# Patient Record
Sex: Female | Born: 1985
Health system: Southern US, Community
[De-identification: ages and names within clinical notes are randomized; demographics above are authoritative.]

## PROBLEM LIST (undated history)

## (undated) DIAGNOSIS — F32A Depression, unspecified: Secondary | ICD-10-CM

## (undated) DIAGNOSIS — F419 Anxiety disorder, unspecified: Secondary | ICD-10-CM

## (undated) DIAGNOSIS — F4310 Post-traumatic stress disorder, unspecified: Secondary | ICD-10-CM

## (undated) DIAGNOSIS — K589 Irritable bowel syndrome without diarrhea: Secondary | ICD-10-CM

## (undated) HISTORY — DX: Anxiety disorder, unspecified: F41.9

## (undated) HISTORY — DX: Post-traumatic stress disorder, unspecified: F43.10

## (undated) HISTORY — DX: Irritable bowel syndrome without diarrhea: K58.9

## (undated) HISTORY — PX: ELBOW FRACTURE SURGERY: SHX616

## (undated) HISTORY — DX: Depression, unspecified: F32.A

---
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
## 2010-12-28 HISTORY — PX: LEEP: SHX91

## 2013-11-16 ENCOUNTER — Other Ambulatory Visit: Payer: Self-pay | Admitting: Endocrinology

## 2013-11-16 DIAGNOSIS — E229 Hyperfunction of pituitary gland, unspecified: Secondary | ICD-10-CM

## 2013-11-28 ENCOUNTER — Ambulatory Visit
Admission: RE | Admit: 2013-11-28 | Discharge: 2013-11-28 | Disposition: A | Discharge status: Home / Self Care | Payer: BC Managed Care – PPO | Source: Ambulatory Visit | Attending: Endocrinology | Admitting: Endocrinology

## 2013-11-28 DIAGNOSIS — E229 Hyperfunction of pituitary gland, unspecified: Secondary | ICD-10-CM

## 2013-11-28 MED ORDER — GADOBENATE DIMEGLUMINE 529 MG/ML IV SOLN
5.0000 mL | Freq: Once | INTRAVENOUS | Status: AC | PRN
  Administered 2013-11-28: 5 mL via INTRAVENOUS

## 2015-10-10 DIAGNOSIS — S62109A Fracture of unspecified carpal bone, unspecified wrist, initial encounter for closed fracture: Secondary | ICD-10-CM

## 2015-10-10 HISTORY — DX: Fracture of unspecified carpal bone, unspecified wrist, initial encounter for closed fracture: S62.109A

## 2017-05-13 DIAGNOSIS — H6983 Other specified disorders of Eustachian tube, bilateral: Secondary | ICD-10-CM

## 2017-05-13 HISTORY — DX: Other specified disorders of eustachian tube, bilateral: H69.83

## 2018-04-21 ENCOUNTER — Emergency Department (HOSPITAL_COMMUNITY): Payer: Self-pay

## 2018-04-21 ENCOUNTER — Encounter (HOSPITAL_COMMUNITY): Payer: Self-pay | Admitting: Emergency Medicine

## 2018-04-21 ENCOUNTER — Emergency Department (HOSPITAL_COMMUNITY)
Admission: EM | Admit: 2018-04-21 | Discharge: 2018-04-21 | Disposition: A | Discharge status: Home / Self Care | Payer: Self-pay | Attending: Emergency Medicine | Admitting: Emergency Medicine

## 2018-04-21 DIAGNOSIS — K5900 Constipation, unspecified: Secondary | ICD-10-CM | POA: Insufficient documentation

## 2018-04-21 DIAGNOSIS — R1011 Right upper quadrant pain: Secondary | ICD-10-CM

## 2018-04-21 DIAGNOSIS — R112 Nausea with vomiting, unspecified: Secondary | ICD-10-CM | POA: Insufficient documentation

## 2018-04-21 DIAGNOSIS — Z79899 Other long term (current) drug therapy: Secondary | ICD-10-CM | POA: Insufficient documentation

## 2018-04-21 LAB — URINALYSIS, ROUTINE W REFLEX MICROSCOPIC
BILIRUBIN URINE: NEGATIVE
Glucose, UA: NEGATIVE mg/dL
KETONES UR: NEGATIVE mg/dL
Nitrite: NEGATIVE
PH: 9 — AB (ref 5.0–8.0)
Protein, ur: NEGATIVE mg/dL
Specific Gravity, Urine: 1.002 — ABNORMAL LOW (ref 1.005–1.030)

## 2018-04-21 LAB — CBC
HCT: 44.7 % (ref 36.0–46.0)
Hemoglobin: 15.4 g/dL — ABNORMAL HIGH (ref 12.0–15.0)
MCH: 32.4 pg (ref 26.0–34.0)
MCHC: 34.5 g/dL (ref 30.0–36.0)
MCV: 94.1 fL (ref 78.0–100.0)
PLATELETS: 182 10*3/uL (ref 150–400)
RBC: 4.75 MIL/uL (ref 3.87–5.11)
RDW: 12.2 % (ref 11.5–15.5)
WBC: 4.2 10*3/uL (ref 4.0–10.5)

## 2018-04-21 LAB — COMPREHENSIVE METABOLIC PANEL
ALT: 16 U/L (ref 14–54)
ANION GAP: 9 (ref 5–15)
AST: 20 U/L (ref 15–41)
Albumin: 4.7 g/dL (ref 3.5–5.0)
Alkaline Phosphatase: 47 U/L (ref 38–126)
BUN: 16 mg/dL (ref 6–20)
CHLORIDE: 105 mmol/L (ref 101–111)
CO2: 25 mmol/L (ref 22–32)
Calcium: 9.9 mg/dL (ref 8.9–10.3)
Creatinine, Ser: 0.66 mg/dL (ref 0.44–1.00)
Glucose, Bld: 98 mg/dL (ref 65–99)
POTASSIUM: 4.4 mmol/L (ref 3.5–5.1)
SODIUM: 139 mmol/L (ref 135–145)
Total Bilirubin: 1 mg/dL (ref 0.3–1.2)
Total Protein: 8.2 g/dL — ABNORMAL HIGH (ref 6.5–8.1)

## 2018-04-21 LAB — LIPASE, BLOOD: LIPASE: 56 U/L — AB (ref 11–51)

## 2018-04-21 LAB — I-STAT BETA HCG BLOOD, ED (MC, WL, AP ONLY): I-stat hCG, quantitative: <5 m[IU]/mL (ref <5)

## 2018-04-21 MED ORDER — IOPAMIDOL (ISOVUE-300) INJECTION 61%
INTRAVENOUS | Status: AC
  Filled 2018-04-21: qty 100

## 2018-04-21 MED ORDER — IOPAMIDOL (ISOVUE-300) INJECTION 61%
100.0000 mL | Freq: Once | INTRAVENOUS | Status: AC | PRN
  Administered 2018-04-21: 100 mL via INTRAVENOUS

## 2018-04-21 MED ORDER — POLYETHYLENE GLYCOL 3350 17 G PO PACK: 17.0000 g | PACK | Freq: Every day | ORAL | 0 refills | Status: DC

## 2018-04-21 MED ORDER — SODIUM CHLORIDE 0.9 % IJ SOLN
INTRAMUSCULAR | Status: AC
  Filled 2018-04-21: qty 50

## 2018-04-21 MED ORDER — ONDANSETRON 4 MG PO TBDP
4.0000 mg | ORAL_TABLET | Freq: Once | ORAL | Status: AC
  Administered 2018-04-21: 4 mg via ORAL
  Filled 2018-04-21: qty 1

## 2018-04-21 MED ORDER — MORPHINE SULFATE (PF) 4 MG/ML IV SOLN
4.0000 mg | Freq: Once | INTRAVENOUS | Status: AC
  Administered 2018-04-21: 4 mg via INTRAVENOUS
  Filled 2018-04-21: qty 1

## 2018-04-21 MED ORDER — SODIUM CHLORIDE 0.9 % IV BOLUS
1000.0000 mL | Freq: Once | INTRAVENOUS | Status: AC
  Administered 2018-04-21: 1000 mL via INTRAVENOUS

## 2018-04-21 NOTE — ED Triage Notes (Signed)
Pt c/o RUQ pains since Saturday with n/v that is worse after eating or drinking.  Reports having diarrhea and then constipation. Family hx gallbladder issues.

## 2018-04-21 NOTE — ED Provider Notes (Signed)
[facility]-EMERGENCY DEPT Provider Note   CSN: [id number] Arrival date & time: 04/21/18  1001     History   Chief Complaint Chief Complaint  Patient presents with  . RUQ pain  . Emesis    HPI Emily Morton is a 32 y.o. female.  HPI  Patient presents with complaint of right upper quadrant pain associated with nausea and vomiting.  She states the symptoms have been intermittent over the past year but over the past 1 week have been much worse.  5 days ago she had intense pain after eating and vomiting.  Vomiting is nonbloody and nonbilious.  Over the past several days she has been eating less and has occasionally had vomiting but the pain has never completely gone away.  This morning she tried to drink coffee for breakfast and had another episode of emesis with worsening pain.  This prompted the visit to the ED.  No fever or chills.  No change in her stools.  She states her abdomen feels bloated.  No history of abdominal surgery.  She has not had any treatment prior to arrival.  There are no other associated systemic symptoms, there are no other alleviating or modifying factors.  Pain is described as crampy and sharp.  History reviewed. No pertinent past medical history.  There are no active problems to display for this patient.   History reviewed. No pertinent surgical history.   OB History   None      Home Medications    Prior to Admission medications   Medication Sig Start Date End Date Taking? Authorizing Provider  B Complex Vitamins (B COMPLEX PO) Take by mouth.   Yes [provider]  levonorgestrel (MIRENA) 20 MCG/24HR IUD 1 each by Intrauterine route once.   Yes [provider]  Multiple Vitamin (MULTIVITAMIN WITH MINERALS) TABS tablet Take 1 tablet by mouth daily.   Yes [provider]  Omega-3 Fatty Acids (FISH OIL PO) Take by mouth.   Yes [provider]  polyethylene glycol (MIRALAX) packet Take 17 g by mouth  daily. 04/21/18   Dajiah Kooi, Latanya Maudlin, MD    Family History No family history on file.  Social History Social History   Tobacco Use  . Smoking status: Former Games developer  . Smokeless tobacco: Never Used  Substance Use Topics  . Alcohol use: Not Currently  . Drug use: Not on file     Allergies   Patient has no known allergies.   Review of Systems Review of Systems  ROS reviewed and all otherwise negative except for mentioned in HPI   Physical Exam Updated Vital Signs BP 103/73 (BP Location: Left Arm)   Pulse 84   Temp 97.7 F (36.5 C) (Oral)   Resp 17   Ht 5\' 1"  (1.549 m)   Wt 58.1 kg (128 lb)   LMP 04/13/2018   SpO2 100%   BMI 24.19 kg/m  Vitals reviewed Physical Exam  Physical Examination: General appearance - alert, well appearing, and in no distress Mental status - alert, oriented to person, place, and time Eyes - no scleral icterus, no conjunctival injection Mouth - mucous membranes moist, pharynx normal without lesions Neck - supple, no significant adenopathy Chest - clear to auscultation, no wheezes, rales or rhonchi, symmetric air entry Heart - normal rate, regular rhythm, normal S1, S2, no murmurs, rubs, clicks or gallops Abdomen - soft, ttp in right upper abdomen, no gaurding or rebound tenderness, nabs, nondistended Neurological - alert, oriented, normal  speechh Extremities - peripheral pulses normal, no pedal edema, no clubbing or cyanosis Skin - normal coloration and turgor, no rashes   ED Treatments / Results  Labs (all labs ordered are listed, but only abnormal results are displayed) Labs Reviewed  LIPASE, BLOOD - Abnormal; Notable for the following components:      Result Value   Lipase 56 (*)    All other components within normal limits  COMPREHENSIVE METABOLIC PANEL - Abnormal; Notable for the following components:   Total Protein 8.2 (*)    All other components within normal limits  CBC - Abnormal; Notable for the following components:    Hemoglobin 15.4 (*)    All other components within normal limits  URINALYSIS, ROUTINE W REFLEX MICROSCOPIC - Abnormal; Notable for the following components:   APPearance HAZY (*)    Specific Gravity, Urine 1.002 (*)    pH 9.0 (*)    Hgb urine dipstick MODERATE (*)    Leukocytes, UA MODERATE (*)    Bacteria, UA RARE (*)    All other components within normal limits  I-STAT BETA HCG BLOOD, ED ([facility], [facility], [facility] ONLY)    EKG None  Radiology Ct Abdomen Pelvis W Contrast  Result Date: 04/21/2018 CLINICAL DATA:  Right upper quadrant pain since Saturday with nausea and vomiting. Alternating diarrhea and constipation. EXAM: CT ABDOMEN AND PELVIS WITH CONTRAST TECHNIQUE: Multidetector CT imaging of the abdomen and pelvis was performed using the standard protocol following bolus administration of intravenous contrast. CONTRAST:  100mL ISOVUE-300 IOPAMIDOL (ISOVUE-300) INJECTION 61% COMPARISON:  Right upper quadrant abdomen ultrasound 04/21/2018 FINDINGS: Lower chest: Normal heart size without pericardial effusion. Dependent bibasilar atelectasis. Hepatobiliary: No space-occupying enhancing mass of the liver. Physiologic distention of the gallbladder without stones. No biliary dilatation. Pancreas: Normal Spleen: Normal Adrenals/Urinary Tract: Normal Stomach/Bowel: The stomach is decompressed. There is normal small bowel rotation without bowel obstruction or inflammation. Normal appendix. Moderate fecal retention within the colon without bowel obstruction or inflammation. Vascular/Lymphatic: No significant vascular findings are present. No enlarged abdominal or pelvic lymph nodes. Reproductive: IUD in the endometrial cavity.  No adnexal mass. Other: No free air nor free fluid.  No herniation. Musculoskeletal: No acute or significant osseous findings. IMPRESSION: Moderate fecal retention within the colon without inflammation. Findings would suggest constipation. Otherwise, normal CT of the abdomen and pelvis.  Electronically Signed   By: Tollie Ethavid  Kwon M.D.   On: 04/21/2018 20:43   [place] Abdomen Limited  Result Date: 04/21/2018 CLINICAL DATA:  Right upper quadrant pain for 1 year EXAM: ULTRASOUND ABDOMEN LIMITED RIGHT UPPER QUADRANT COMPARISON:  None. FINDINGS: Gallbladder: No gallstones or wall thickening visualized. No sonographic Murphy sign noted by sonographer. Common bile duct: Diameter: 2.4 mm Liver: No focal lesion identified. Within normal limits in parenchymal echogenicity. Portal vein is patent on color Doppler imaging with normal direction of blood flow towards the liver. IMPRESSION: Negative right upper quadrant abdominal ultrasound Electronically Signed   By: Jasmine PangKim  Fujinaga M.D.   On: 04/21/2018 17:57    Procedures Procedures (including critical care time)  Medications Ordered in ED Medications  sodium chloride 0.9 % injection (has no administration in time range)  iopamidol (ISOVUE-300) 61 % injection (has no administration in time range)  ondansetron (ZOFRAN-ODT) disintegrating tablet 4 mg (4 mg Oral Given 04/21/18 1015)  morphine 4 MG/ML injection 4 mg (4 mg Intravenous Given 04/21/18 1743)  sodium chloride 0.9 % bolus 1,000 mL (0 mLs Intravenous Stopped 04/21/18 1856)  iopamidol (ISOVUE-300) 61 % injection  100 mL (100 mLs Intravenous Contrast Given 04/21/18 2023)     Initial Impression / Assessment and Plan / ED Course  I have reviewed the triage vital signs and the nursing notes.  Pertinent labs & imaging results that were available during my care of the patient were reviewed by me and considered in my medical decision making (see chart for details).    Patient presenting with complaint of right upper quadrant pain.  She states the pain is been constant for the past week.  Her labs are reassuring with the exception of very mild elevation in lipase.  Symptoms consistent with biliary colic so right upper quadrant ultrasound obtained.  This was reassuring with no gallstones or inflammation.   Due to ongoing pain and mild elevation in lipase abdominal CT was obtained.  This was also reassuring.  There were findings consistent with constipation.  Patient has no history of breath and states she has frequent bowel movement however with the findings seen on scan will start on MiraLAX.  All results and plan discussed with patient at the bedside.  Discharged with strict return precautions.  Pt agreeable with plan.  Final Clinical Impressions(s) / ED Diagnoses   Final diagnoses:  Right upper quadrant abdominal pain  Constipation, unspecified constipation type    ED Discharge Orders        Ordered    polyethylene glycol (MIRALAX) packet  Daily     04/21/18 2120       Phillis Haggis, MD 04/21/18 2323

## 2018-04-21 NOTE — ED Notes (Signed)
Pt had drawn for labs:  Gold Red Blue Lavender Lt green Dark green x2 

## 2018-04-21 NOTE — Discharge Instructions (Signed)
Return to the ED with any concerns including vomiting and not able to keep down liquids or your medications, abdominal pain especially if it localizes to the right lower abdomen, fever or chills, and decreased urine output, decreased level of alertness or lethargy, or any other alarming symptoms.  °

## 2018-04-21 NOTE — ED Notes (Signed)
Ultrasound at the bedside

## 2018-05-30 DIAGNOSIS — R0781 Pleurodynia: Secondary | ICD-10-CM | POA: Diagnosis not present

## 2018-05-30 DIAGNOSIS — R350 Frequency of micturition: Secondary | ICD-10-CM | POA: Diagnosis not present

## 2018-05-30 DIAGNOSIS — K5909 Other constipation: Secondary | ICD-10-CM | POA: Diagnosis not present

## 2018-05-30 DIAGNOSIS — N390 Urinary tract infection, site not specified: Secondary | ICD-10-CM | POA: Diagnosis not present

## 2018-05-30 DIAGNOSIS — M940 Chondrocostal junction syndrome [Tietze]: Secondary | ICD-10-CM | POA: Diagnosis not present

## 2018-06-29 DIAGNOSIS — R102 Pelvic and perineal pain: Secondary | ICD-10-CM | POA: Diagnosis not present

## 2018-06-29 DIAGNOSIS — Z01419 Encounter for gynecological examination (general) (routine) without abnormal findings: Secondary | ICD-10-CM | POA: Diagnosis not present

## 2018-06-29 DIAGNOSIS — R140 Abdominal distension (gaseous): Secondary | ICD-10-CM | POA: Diagnosis not present

## 2018-06-29 DIAGNOSIS — Z6824 Body mass index (BMI) 24.0-24.9, adult: Secondary | ICD-10-CM | POA: Diagnosis not present

## 2018-07-20 DIAGNOSIS — Z1329 Encounter for screening for other suspected endocrine disorder: Secondary | ICD-10-CM | POA: Diagnosis not present

## 2018-07-20 DIAGNOSIS — R102 Pelvic and perineal pain: Secondary | ICD-10-CM | POA: Diagnosis not present

## 2018-07-20 DIAGNOSIS — Z0000 Encounter for general adult medical examination without abnormal findings: Secondary | ICD-10-CM | POA: Diagnosis not present

## 2018-07-20 DIAGNOSIS — Z13220 Encounter for screening for lipoid disorders: Secondary | ICD-10-CM | POA: Diagnosis not present

## 2018-07-20 DIAGNOSIS — Z130 Encounter for screening for diseases of the blood and blood-forming organs and certain disorders involving the immune mechanism: Secondary | ICD-10-CM | POA: Diagnosis not present

## 2019-08-03 IMAGING — US US ABDOMEN LIMITED
1 series · 14 of 25 positions shown · non-contrast
Comparison: None.

CLINICAL DATA: Right upper quadrant pain for 1 year

EXAM:
ULTRASOUND ABDOMEN LIMITED RIGHT UPPER QUADRANT

[Series 1: us abdomen limited · 14 of 61 slices shown]
[im 1/61]
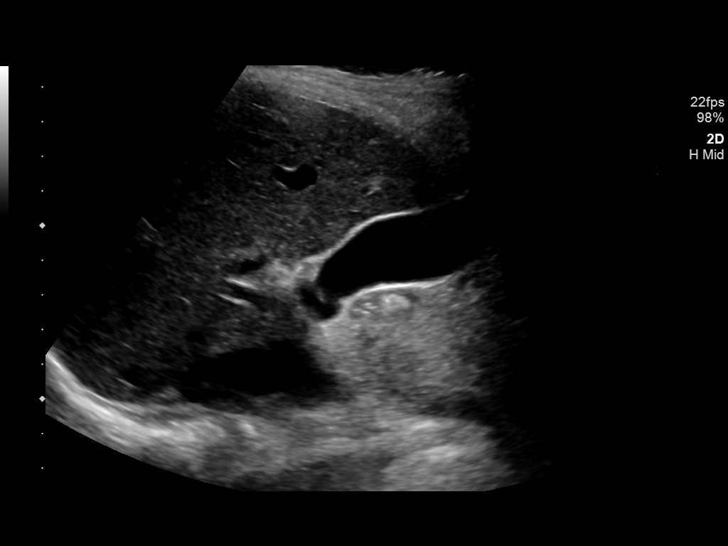
[im 6/61]
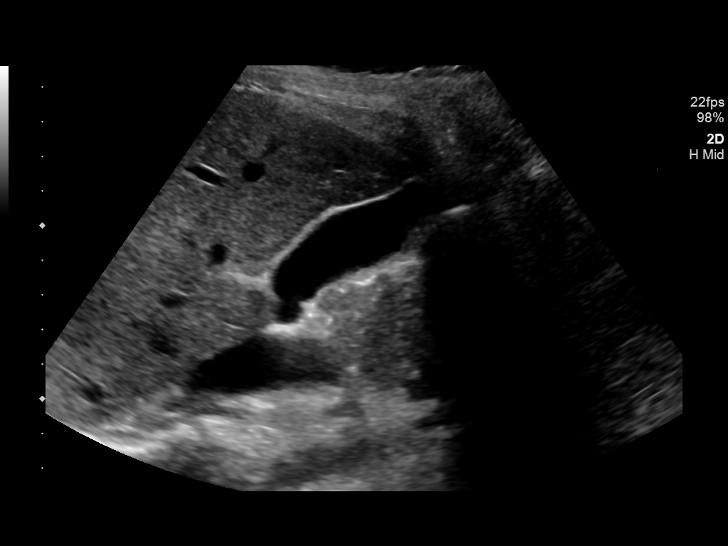
[im 11/61]
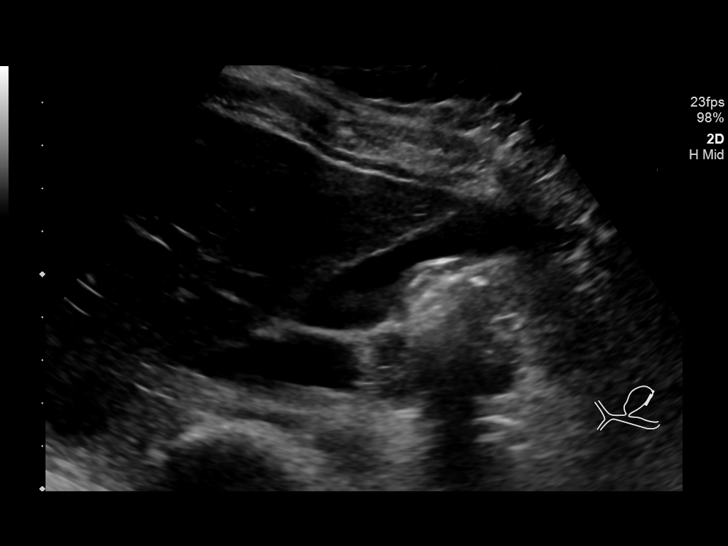
[im 16/61]
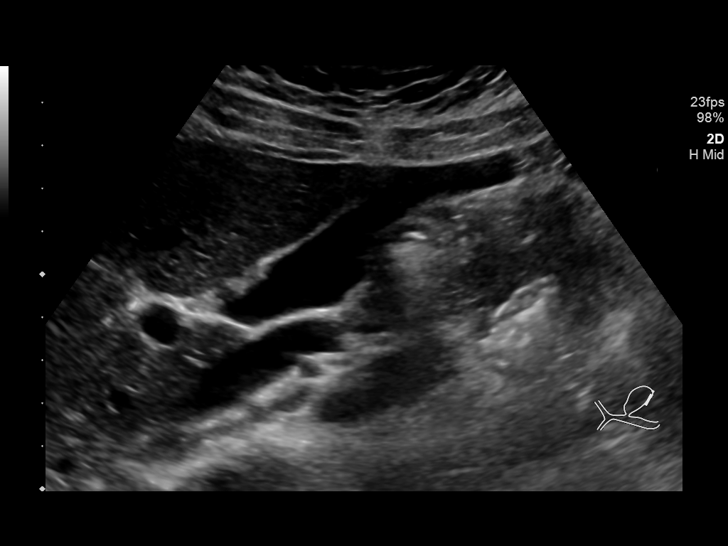
[im 21/61]
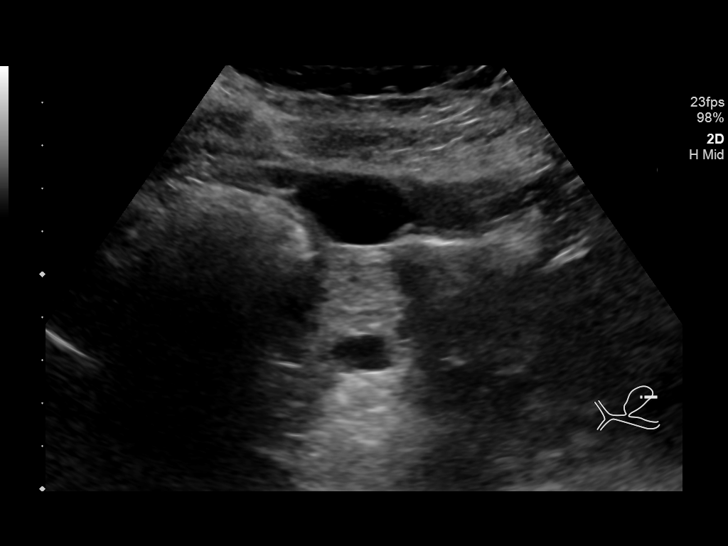
[im 23/61]
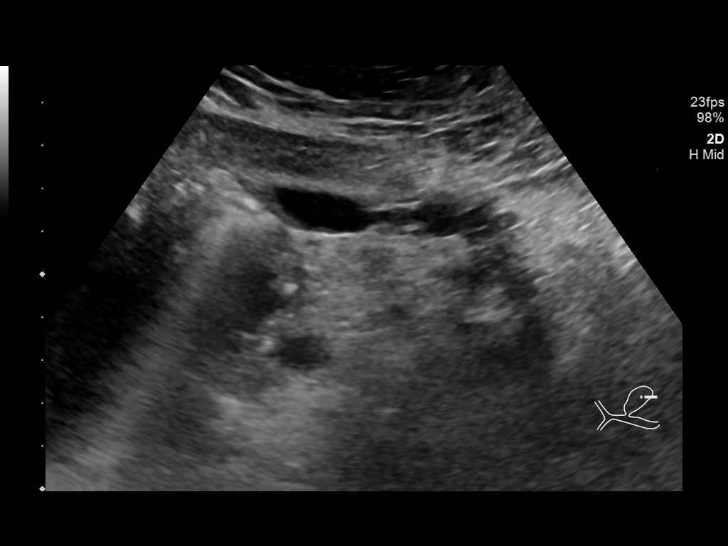
[im 28/61]
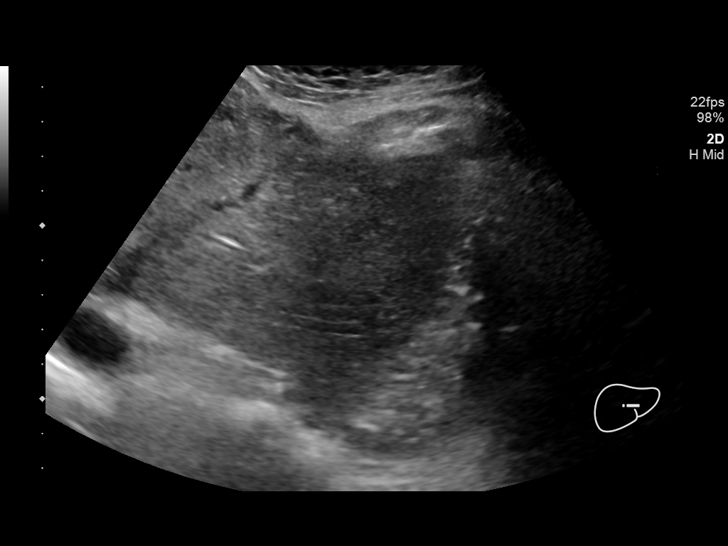
[im 33/61]
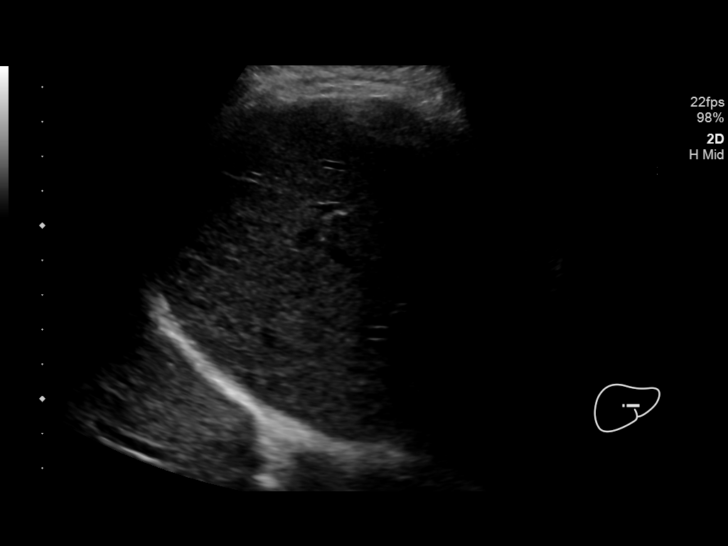
[im 38/61]
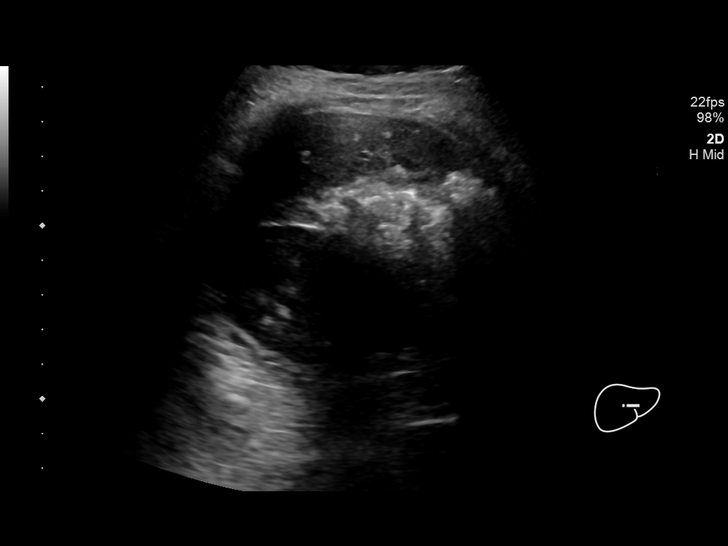
[im 41/61]
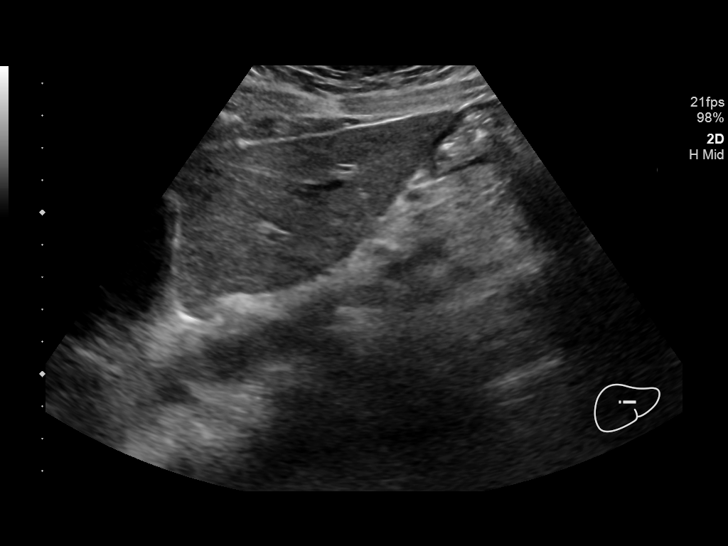
[im 46/61]
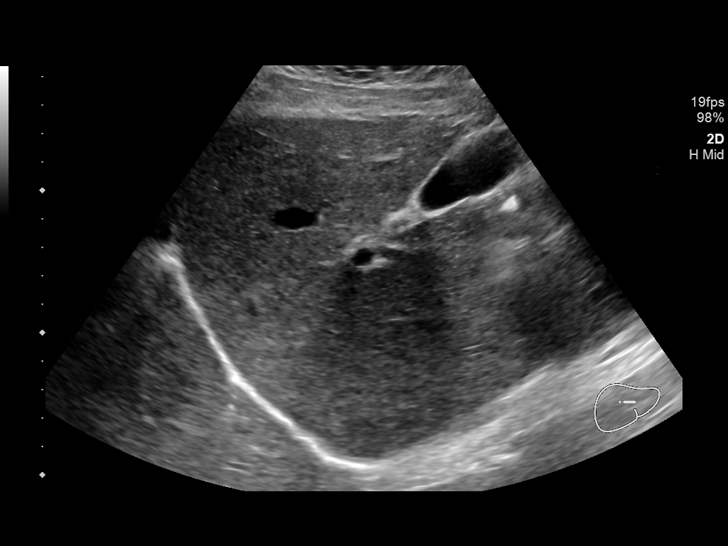
[im 51/61]
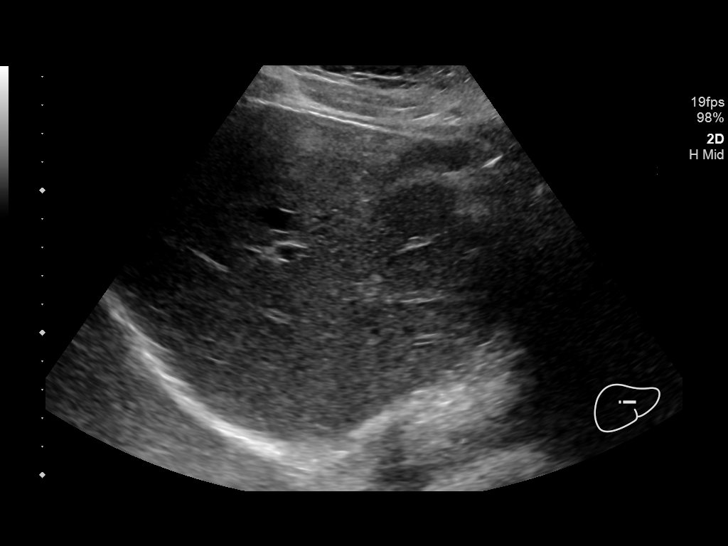
[im 56/61]
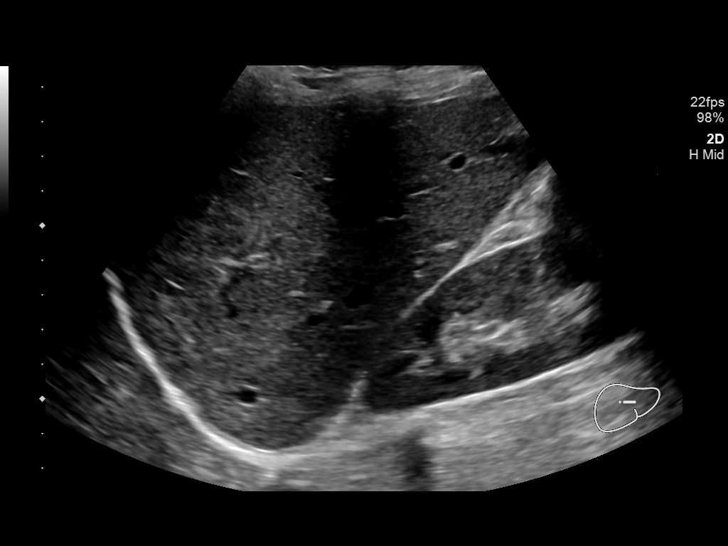
[im 61/61]
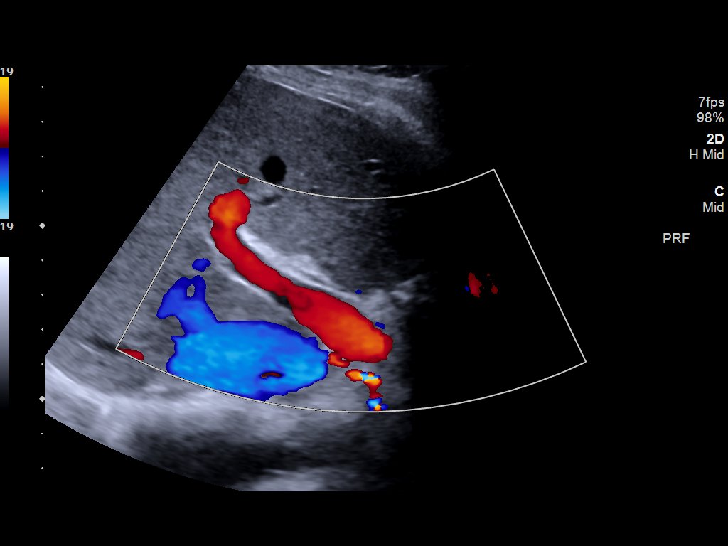

[14 of 25 positions shown; findings below may reference images not displayed]

FINDINGS: Gallbladder:

No gallstones or wall thickening visualized. No sonographic Murphy
sign noted by sonographer.

Common bile duct:

Diameter: 2.4 mm

Liver:

No focal lesion identified. Within normal limits in parenchymal
echogenicity. Portal vein is patent on color Doppler imaging with
normal direction of blood flow towards the liver.
IMPRESSION: Negative right upper quadrant abdominal ultrasound

## 2019-08-03 IMAGING — CT CT ABD-PELV W/ CM
2 of 4 series · 16 of 46 positions shown, 18 images · IV contrast (ISOVUE)
Comparison: Right upper quadrant abdomen ultrasound 04/21/2018

CLINICAL DATA: Right upper quadrant pain since [REDACTED] with nausea
and vomiting. Alternating diarrhea and constipation.

EXAM:
CT ABDOMEN AND PELVIS WITH CONTRAST
TECHNIQUE: Multidetector CT imaging of the abdomen and pelvis was performed
using the standard protocol following bolus administration of
intravenous contrast.
CONTRAST:  100mL WMSAAF-3QQ IOPAMIDOL (WMSAAF-3QQ) INJECTION 61%

[Series 2: axial st · axial · 0.63mm/px · z∈[-759,-359]mm · 13 of 90 slices shown, 15 images]
[im 5/90  soft-tissue]
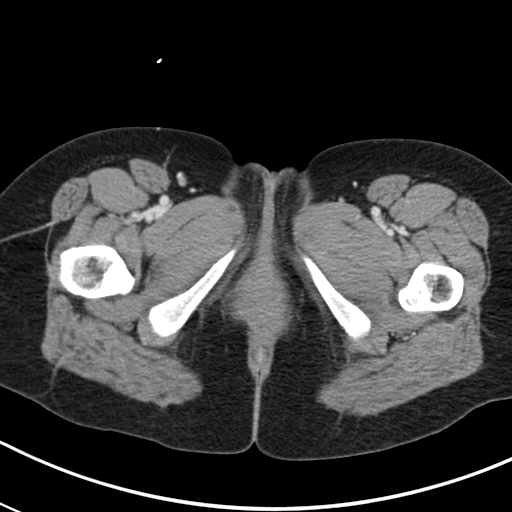
[im 5/90  bone]
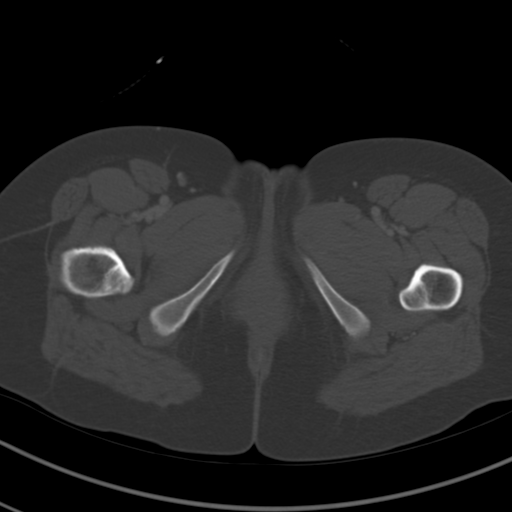
[im 14/90  soft-tissue]
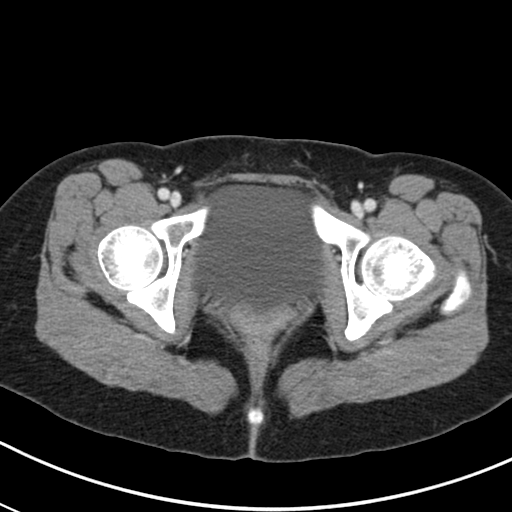
[im 18/90  soft-tissue]
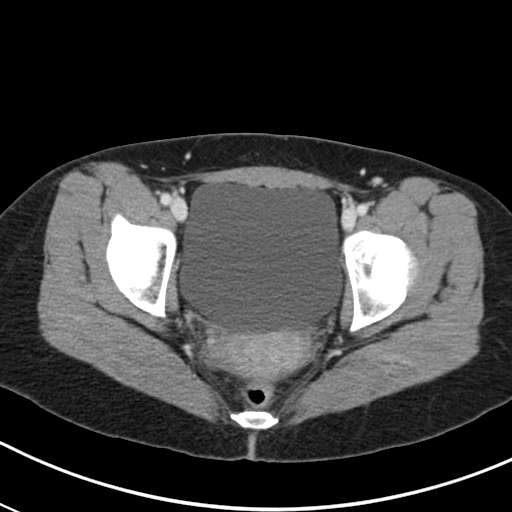
[im 27/90  soft-tissue]
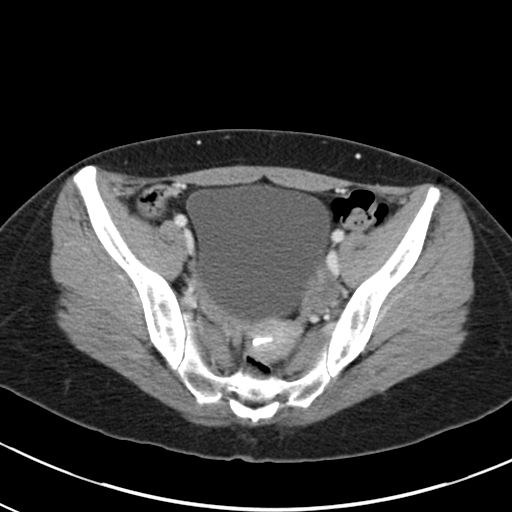
[im 32/90  soft-tissue]
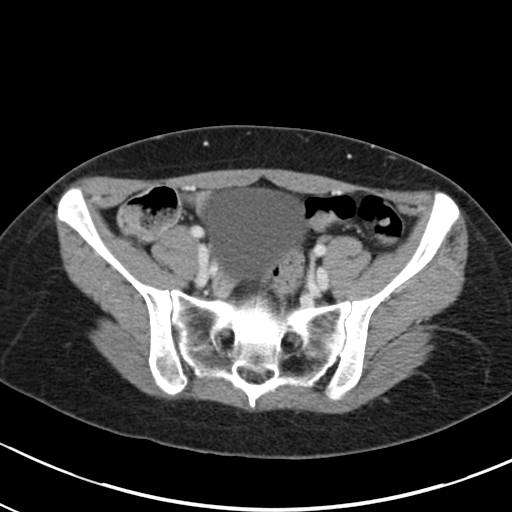
[im 41/90  soft-tissue]
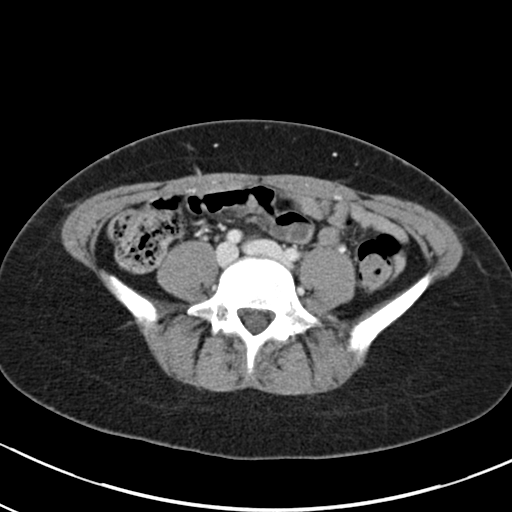
[im 45/90  soft-tissue]
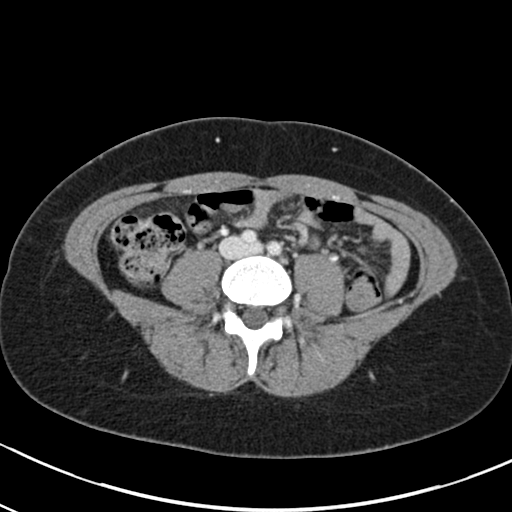
[im 49/90  soft-tissue]
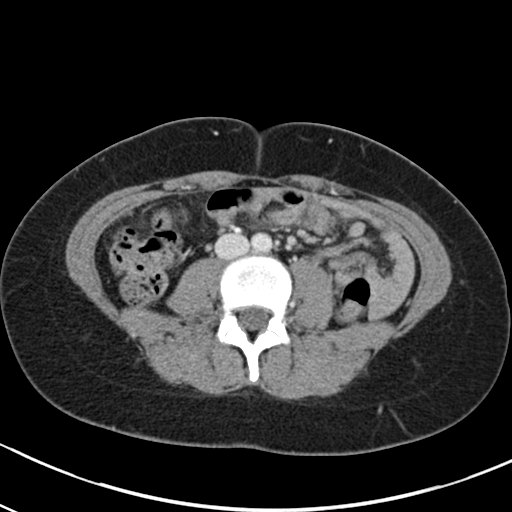
[im 58/90  soft-tissue]
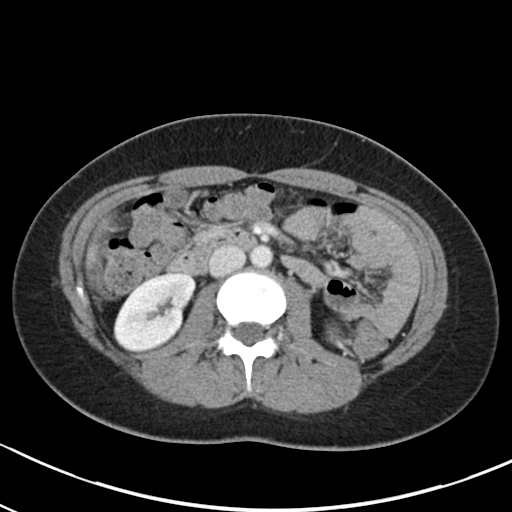
[im 58/90  bone]
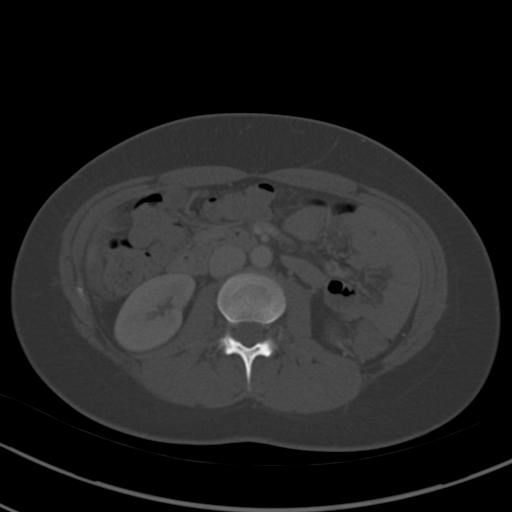
[im 63/90  soft-tissue]
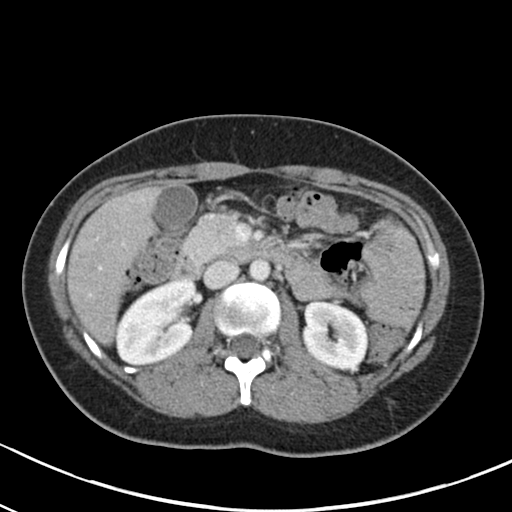
[im 72/90  soft-tissue]
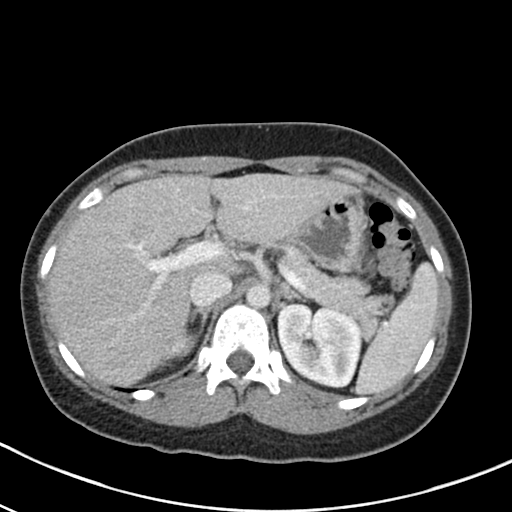
[im 76/90  soft-tissue]
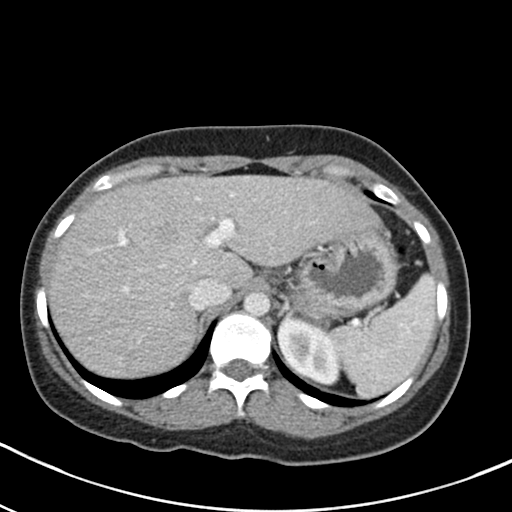
[im 85/90  soft-tissue]
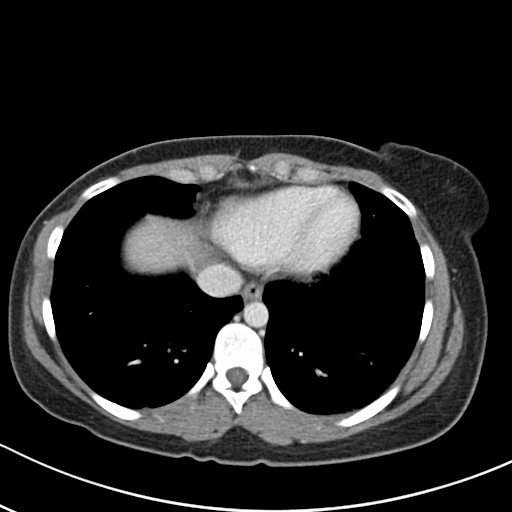

[Series 5: coronal st · coronal · 0.74mm/px · 3 of 82 slices shown]
[im 28/82  soft-tissue]
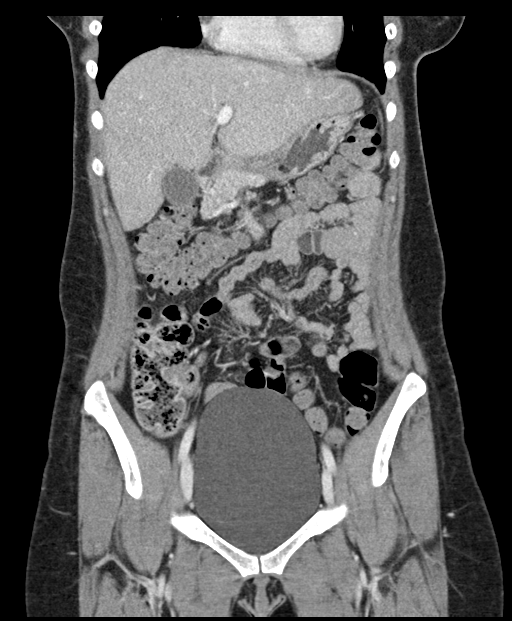
[im 37/82  soft-tissue]
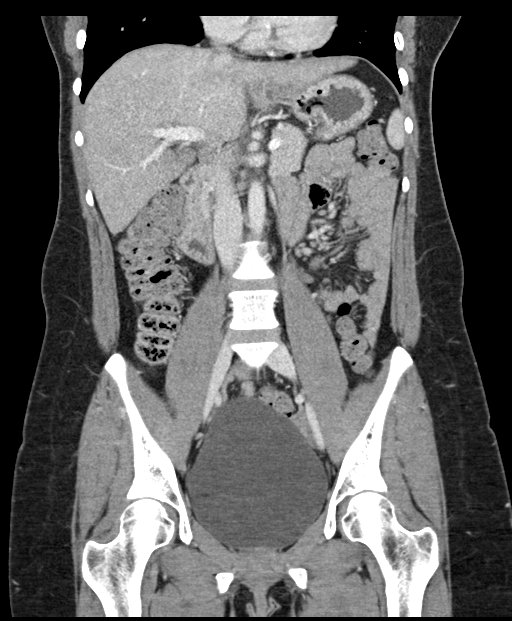
[im 46/82  soft-tissue]
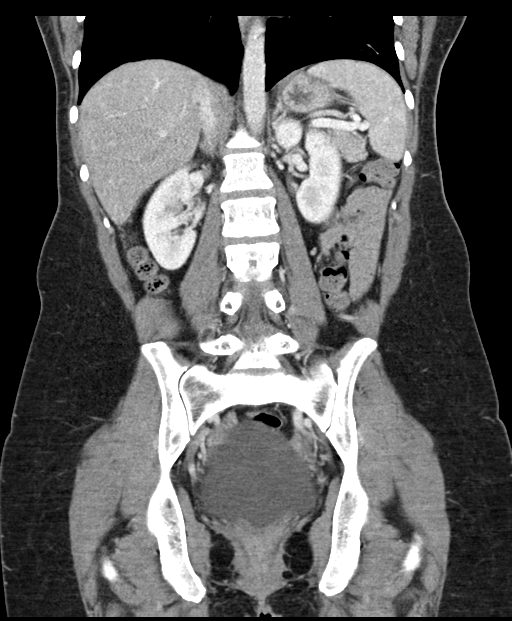

[16 of 46 positions shown; findings below may reference images not displayed]

FINDINGS: Lower chest: Normal heart size without pericardial effusion.
Dependent bibasilar atelectasis.

Hepatobiliary: No space-occupying enhancing mass of the liver.
Physiologic distention of the gallbladder without stones. No biliary
dilatation.

Pancreas: Normal

Spleen: Normal

Adrenals/Urinary Tract: Normal

Stomach/Bowel: The stomach is decompressed. There is normal small
bowel rotation without bowel obstruction or inflammation. Normal
appendix. Moderate fecal retention within the colon without bowel
obstruction or inflammation.

Vascular/Lymphatic: No significant vascular findings are present. No
enlarged abdominal or pelvic lymph nodes.

Reproductive: IUD in the endometrial cavity.  No adnexal mass.

Other: No free air nor free fluid.  No herniation.

Musculoskeletal: No acute or significant osseous findings.
IMPRESSION: Moderate fecal retention within the colon without inflammation.
Findings would suggest constipation. Otherwise, normal CT of the
abdomen and pelvis.

## 2020-09-11 ENCOUNTER — Encounter: Payer: Self-pay | Admitting: Physician Assistant

## 2020-09-28 DIAGNOSIS — R0602 Shortness of breath: Secondary | ICD-10-CM | POA: Diagnosis not present

## 2020-09-28 DIAGNOSIS — R1110 Vomiting, unspecified: Secondary | ICD-10-CM | POA: Diagnosis not present

## 2020-09-28 DIAGNOSIS — T7020XA Unspecified effects of high altitude, initial encounter: Secondary | ICD-10-CM

## 2020-09-28 HISTORY — DX: Unspecified effects of high altitude, initial encounter: T70.20XA

## 2020-10-15 ENCOUNTER — Encounter: Payer: Self-pay | Admitting: Physician Assistant

## 2020-10-15 ENCOUNTER — Other Ambulatory Visit (INDEPENDENT_AMBULATORY_CARE_PROVIDER_SITE_OTHER): Payer: BC Managed Care – PPO

## 2020-10-15 ENCOUNTER — Ambulatory Visit (INDEPENDENT_AMBULATORY_CARE_PROVIDER_SITE_OTHER): Payer: BC Managed Care – PPO | Admitting: Physician Assistant

## 2020-10-15 VITALS — BP 110/80 | HR 72 | Ht 61.25 in | Wt 133.1 lb

## 2020-10-15 DIAGNOSIS — R1032 Left lower quadrant pain: Secondary | ICD-10-CM

## 2020-10-15 DIAGNOSIS — R1031 Right lower quadrant pain: Secondary | ICD-10-CM

## 2020-10-15 DIAGNOSIS — K5909 Other constipation: Secondary | ICD-10-CM

## 2020-10-15 DIAGNOSIS — R110 Nausea: Secondary | ICD-10-CM | POA: Diagnosis not present

## 2020-10-15 LAB — COMPREHENSIVE METABOLIC PANEL
ALT: 22 U/L (ref 0–35)
AST: 23 U/L (ref 0–37)
Albumin: 4.5 g/dL (ref 3.5–5.2)
Alkaline Phosphatase: 52 U/L (ref 39–117)
BUN: 14 mg/dL (ref 6–23)
CO2: 28 mEq/L (ref 19–32)
Calcium: 9.8 mg/dL (ref 8.4–10.5)
Chloride: 102 mEq/L (ref 96–112)
Creatinine, Ser: 0.7 mg/dL (ref 0.40–1.20)
GFR: 113.8 mL/min (ref >60.00)
Glucose, Bld: 95 mg/dL (ref 70–99)
Potassium: 3.9 mEq/L (ref 3.5–5.1)
Sodium: 136 mEq/L (ref 135–145)
Total Bilirubin: 0.4 mg/dL (ref 0.2–1.2)
Total Protein: 7.4 g/dL (ref 6.0–8.3)

## 2020-10-15 LAB — CBC WITH DIFFERENTIAL/PLATELET
Basophils Absolute: 0.1 10*3/uL (ref 0.0–0.1)
Basophils Relative: 0.7 % (ref 0.0–3.0)
Eosinophils Absolute: 0.1 10*3/uL (ref 0.0–0.7)
Eosinophils Relative: 0.7 % (ref 0.0–5.0)
HCT: 42.2 % (ref 36.0–46.0)
Hemoglobin: 14.6 g/dL (ref 12.0–15.0)
Lymphocytes Relative: 31.5 % (ref 12.0–46.0)
Lymphs Abs: 2.1 10*3/uL (ref 0.7–4.0)
MCHC: 34.6 g/dL (ref 30.0–36.0)
MCV: 95 fl (ref 78.0–100.0)
Monocytes Absolute: 0.5 10*3/uL (ref 0.1–1.0)
Monocytes Relative: 7.7 % (ref 3.0–12.0)
Neutro Abs: 4 10*3/uL (ref 1.4–7.7)
Neutrophils Relative %: 59.4 % (ref 43.0–77.0)
Platelets: 201 10*3/uL (ref 150.0–400.0)
RBC: 4.44 Mil/uL (ref 3.87–5.11)
RDW: 12 % (ref 11.5–15.5)
WBC: 6.7 10*3/uL (ref 4.0–10.5)

## 2020-10-15 MED ORDER — LINACLOTIDE 290 MCG PO CAPS: 290.0000 ug | ORAL_CAPSULE | Freq: Every day | ORAL | 5 refills | Status: DC

## 2020-10-15 MED ORDER — NA SULFATE-K SULFATE-MG SULF 17.5-3.13-1.6 GM/177ML PO SOLN: 1.0000 | Freq: Once | ORAL | 0 refills | Status: AC

## 2020-10-15 NOTE — Patient Instructions (Addendum)
If you are age 33 or older, your body mass index should be between 23-30. Your Body mass index is 24.95 kg/m. If this is out of the aforementioned range listed, please consider follow up with your Primary Care Provider.  If you are age 83 or younger, your body mass index should be between 19-25. Your Body mass index is 24.95 kg/m. If this is out of the aformentioned range listed, please consider follow up with your Primary Care Provider.   Your provider has requested that you go to the basement level for lab work before leaving today. Press "B" on the elevator. The lab is located at the first door on the left as you exit the elevator.  We have sent the following medications to your pharmacy for you to pick up at your convenience: Linzess take 1 daily. We have given you 2 sample bottles 290 mcg with 4 capsules in each  Stop taking magnesium  Due to recent changes in healthcare laws, you may see the results of your imaging and laboratory studies on MyChart before your provider has had a chance to review them.  We understand that in some cases there may be results that are confusing or concerning to you. Not all laboratory results come back in the same time frame and the provider may be waiting for multiple results in order to interpret others.  Please give [place] 48 hours in order for your provider to thoroughly review all the results before contacting the office for clarification of your results.

## 2020-10-15 NOTE — Progress Notes (Signed)
Chief Complaint: IBS  HPI:    Emily Morton is a 34 year old female with a past medical history as listed below, who presents clinic today with a complaint of IBS.    Today, the patient presents clinic and tells me that over the years she has been told that she has IBS by her gynecologist and her primary care provider because she has so many "stomach problems".  Tells me that she used to radiate back-and-forth from diarrhea to constipation but since she hit the age of 16 she is now mostly constipated and currently is using Magnesium citrate 400 to 800 mg per night as well as occasional suppositories and stool softeners in order to have a daily bowel movement, regardless of doing this she has a lot of lower abdominal cramping which "feels like a menstrual cramp", rated as an 8-9/10 and when she is having a flare, which happens a couple days out of the week, she just feels fatigued, sore and also gets some nausea and excessive gas.  Tells me she has tried elimination diets including keto and the low FODMAP in the past and seems to do okay for a while but then is triggered by something and she cannot tell what.  Tells me she has taken probiotics off and on but sometimes they seem to make things worse.     Explains that she does have anxiety and feels like this makes her symptoms worse.  Currently is working at [employer].    Denies fever, chills, blood in her stool, weight loss or symptoms that awaken her from sleep.     Past Medical History:  Diagnosis Date  . Anxiety   . Depression   . IBS (irritable bowel syndrome)   . PTSD (post-traumatic stress disorder)     Past Surgical History:  Procedure Laterality Date  . ELBOW FRACTURE SURGERY Left    growth plate  . LEEP  2012    Current Outpatient Medications  Medication Sig Dispense Refill  . bisacodyl (BISACODYL) 5 MG EC tablet Take 5 mg by mouth as needed for moderate constipation.    Marland Kitchen MAGNESIUM CITRATE PO Take 800 mg by mouth daily.    .  Omega-3 Fatty Acids (FISH OIL PO) Take by mouth.     No current facility-administered medications for this visit.    Allergies as of 10/15/2020  . (No Known Allergies)    Family History  Problem Relation Age of Onset  . Fibromyalgia Sister   . Breast cancer Cousin     Social History   Socioeconomic History  . Marital status: Single    Spouse name: Not on file  . Number of children: 0  . Years of education: Not on file  . Highest education level: Not on file  Occupational History  . Occupation: [employer]  Tobacco Use  . Smoking status: Former Smoker    Types: Cigarettes    Quit date: 2016    Years since quitting: 5.8  . Smokeless tobacco: Never Used  Vaping Use  . Vaping Use: Never used  Substance and Sexual Activity  . Alcohol use: Yes    Comment: occasional/rarely  . Drug use: Never  . Sexual activity: Not on file  Other Topics Concern  . Not on file  Social History Narrative  . Not on file   Social Determinants of Health   Financial Resource Strain:   . Difficulty of Paying Living Expenses: Not on file  Food Insecurity:   . Worried  About Running Out of Food in the Last Year: Not on file  . Ran Out of Food in the Last Year: Not on file  Transportation Needs:   . Lack of Transportation (Medical): Not on file  . Lack of Transportation (Non-Medical): Not on file  Physical Activity:   . Days of Exercise per Week: Not on file  . Minutes of Exercise per Session: Not on file  Stress:   . Feeling of Stress : Not on file  Social Connections:   . Frequency of Communication with Friends and Family: Not on file  . Frequency of Social Gatherings with Friends and Family: Not on file  . Attends Religious Services: Not on file  . Active Member of Clubs or Organizations: Not on file  . Attends Banker Meetings: Not on file  . Marital Status: Not on file  Intimate Partner Violence:   . Fear of Current or Ex-Partner: Not on file  . Emotionally Abused:  Not on file  . Physically Abused: Not on file  . Sexually Abused: Not on file    Review of Systems:    Constitutional: No weight loss, fever or chills Skin: No rash  Cardiovascular: No chest pain   Respiratory: No SOB Gastrointestinal: See HPI and otherwise negative Genitourinary: No dysuria  Neurological: No headache Musculoskeletal: No new muscle or joint pain Hematologic: No bleeding or bruising Psychiatric: +anxiety   Physical Exam:  Vital signs: BP 110/80 (BP Location: Left Arm, Patient Position: Sitting, Cuff Size: Normal)   Pulse 72   Ht 5' 1.25" (1.556 m) Comment: height measured without shoes  Wt 133 lb 2 oz (60.4 kg)   LMP 10/07/2020   BMI 24.95 kg/m   Constitutional:   Pleasant Caucasian female appears to be in NAD, Well developed, Well nourished, alert and cooperative Head:  Normocephalic and atraumatic. Eyes:   PEERL, EOMI. No icterus. Conjunctiva pink. Ears:  Normal auditory acuity. Neck:  Supple Throat: Oral cavity and pharynx without inflammation, swelling or lesion.  Respiratory: Respirations even and unlabored. Lungs clear to auscultation bilaterally.   No wheezes, crackles, or rhonchi.  Cardiovascular: Normal S1, S2. No MRG. Regular rate and rhythm. No peripheral edema, cyanosis or pallor.  Gastrointestinal:  Soft, nondistended, nontender. No rebound or guarding. Normal bowel sounds. No appreciable masses or hepatomegaly. Rectal:  Not performed.  Msk:  Symmetrical without gross deformities. Without edema, no deformity or joint abnormality.  Neurologic:  Alert and  oriented x4;  grossly normal neurologically.  Skin:   Dry and intact without significant lesions or rashes. Psychiatric: Demonstrates good judgement and reason without abnormal affect or behaviors.  No recent labs or imaging.  Assessment: 1.  Lower abdominal cramping: Likely with IBS/constipation 2.  Nausea: With below 3.  Constipation: For the past 3 years per patient, some better with mag  citrate, associated with some lower abdominal cramping; consider IBS most likely  Plan: 1.  Ordered CBC, CMP and celiac panel 2.  Scheduled the patient for diagnostic EGD and colonoscopy with Dr. Adela Lank.  Did discuss risks, benefits, limitations and alternatives and the patient agrees to proceed.  She has had her Covid vaccines. 3.  Would recommend the patient discontinue her Mag citrate and instead prescribed Linzess 290 mcg daily, 30 minutes before breakfast #30 with 5 refills.  Also provided the patient with samples today. 4.  Patient to follow in clinic per recommendations from Dr. Adela Lank after time of procedure.  Hyacinth Meeker, PA-C [facility] 10/15/2020, 2:53 PM

## 2020-10-16 LAB — CELIAC PANEL 10: Gliadin IgG: 5 units (ref 0–19)

## 2020-10-16 NOTE — Progress Notes (Signed)
Agree with assessment and plan as outlined.  

## 2020-10-17 LAB — CELIAC PANEL 10
Antigliadin Abs, IgA: 3 units (ref 0–19)
Endomysial IgA: NEGATIVE
IgA/Immunoglobulin A, Serum: 241 mg/dL (ref 87–352)
Tissue Transglut Ab: 4 U/mL (ref 0–5)
Transglutaminase IgA: <2 U/mL (ref 0–3)

## 2020-11-04 DIAGNOSIS — E039 Hypothyroidism, unspecified: Secondary | ICD-10-CM | POA: Diagnosis not present

## 2020-11-04 DIAGNOSIS — Z1151 Encounter for screening for human papillomavirus (HPV): Secondary | ICD-10-CM | POA: Diagnosis not present

## 2020-11-04 DIAGNOSIS — Z01419 Encounter for gynecological examination (general) (routine) without abnormal findings: Secondary | ICD-10-CM | POA: Diagnosis not present

## 2020-11-04 DIAGNOSIS — Z6825 Body mass index (BMI) 25.0-25.9, adult: Secondary | ICD-10-CM | POA: Diagnosis not present

## 2020-11-19 ENCOUNTER — Other Ambulatory Visit: Payer: Self-pay

## 2020-11-19 ENCOUNTER — Encounter: Payer: Self-pay | Admitting: Gastroenterology

## 2020-11-19 ENCOUNTER — Ambulatory Visit (AMBULATORY_SURGERY_CENTER): Payer: BC Managed Care – PPO | Admitting: Gastroenterology

## 2020-11-19 ENCOUNTER — Other Ambulatory Visit: Payer: Self-pay | Admitting: Gastroenterology

## 2020-11-19 VITALS — BP 124/71 | HR 62 | Temp 97.1°F | Resp 18 | Ht 61.0 in | Wt 133.0 lb

## 2020-11-19 DIAGNOSIS — K2970 Gastritis, unspecified, without bleeding: Secondary | ICD-10-CM

## 2020-11-19 DIAGNOSIS — K2950 Unspecified chronic gastritis without bleeding: Secondary | ICD-10-CM | POA: Diagnosis not present

## 2020-11-19 DIAGNOSIS — R110 Nausea: Secondary | ICD-10-CM

## 2020-11-19 DIAGNOSIS — R194 Change in bowel habit: Secondary | ICD-10-CM

## 2020-11-19 DIAGNOSIS — R109 Unspecified abdominal pain: Secondary | ICD-10-CM | POA: Diagnosis not present

## 2020-11-19 DIAGNOSIS — R1032 Left lower quadrant pain: Secondary | ICD-10-CM

## 2020-11-19 DIAGNOSIS — R1031 Right lower quadrant pain: Secondary | ICD-10-CM

## 2020-11-19 HISTORY — PX: COLONOSCOPY WITH ESOPHAGOGASTRODUODENOSCOPY (EGD): SHX5779

## 2020-11-19 MED ORDER — SODIUM CHLORIDE 0.9 % IV SOLN: 500.0000 mL | Freq: Once | INTRAVENOUS | Status: DC

## 2020-11-19 NOTE — Progress Notes (Signed)
Pt's states no medical or surgical changes since previsit or office visit.  JD - vitals 

## 2020-11-19 NOTE — Op Note (Signed)
[facility] Patient Name: Emily Morton Procedure Date: 11/19/2020 2:15 PM MRN: [id number] Endoscopist: Viviann Spare P. Adela Lank , MD Age: 34 Referring MD:  Date of Birth: 30-Oct-1986 Gender: Female Account #: [id number] Procedure:                Upper GI endoscopy Indications:              Abdominal pain, Nausea Medicines:                Monitored Anesthesia Care Procedure:                Pre-Anesthesia Assessment:                           - Prior to the procedure, a History and Physical                            was performed, and patient medications and                            allergies were reviewed. The patient's tolerance of                            previous anesthesia was also reviewed. The risks                            and benefits of the procedure and the sedation                            options and risks were discussed with the patient.                            All questions were answered, and informed consent                            was obtained. Prior Anticoagulants: The patient has                            taken no previous anticoagulant or antiplatelet                            agents. ASA Grade Assessment: II - A patient with                            mild systemic disease. After reviewing the risks                            and benefits, the patient was deemed in                            satisfactory condition to undergo the procedure.                           After obtaining informed consent, the endoscope was  passed under direct vision. Throughout the                            procedure, the patient's blood pressure, pulse, and                            oxygen saturations were monitored continuously. The                            Endoscope was introduced through the mouth, and                            advanced to the second part of duodenum. The upper                            GI endoscopy was  accomplished without difficulty.                            The patient tolerated the procedure well. Scope In: Scope Out: Findings:                 Esophagogastric landmarks were identified: the                            Z-line was found at 39 cm, the gastroesophageal                            junction was found at 39 cm and the upper extent of                            the gastric folds was found at 39 cm from the                            incisors.                           The exam of the esophagus was otherwise normal.                           The entire examined stomach was normal. Biopsies                            were taken with a cold forceps for Helicobacter                            pylori testing.                           The duodenal bulb and second portion of the                            duodenum were normal. Complications:            No immediate complications. Estimated blood loss:  Minimal. Estimated Blood Loss:     Estimated blood loss was minimal. Impression:               - Esophagogastric landmarks identified.                           - Normal esophagus.                           - Normal stomach. Biopsied to rule out H pylori.                           - Normal duodenal bulb and second portion of the                            duodenum. Recommendation:           - Patient has a contact number available for                            emergencies. The signs and symptoms of potential                            delayed complications were discussed with the                            patient. Return to normal activities tomorrow.                            Written discharge instructions were provided to the                            patient.                           - Resume previous diet.                           - Continue present medications.                           - Await pathology results with further                             recommendations Viviann Spare P. Salome Hautala, MD 11/19/2020 2:51:15 PM This report has been signed electronically.

## 2020-11-19 NOTE — Op Note (Addendum)
[facility] Patient Name: Emily Morton Procedure Date: 11/19/2020 2:15 PM MRN: [id number] Endoscopist: Viviann Spare P. Adela Lank , MD Age: 34 Referring MD:  Date of Birth: 1986/12/21 Gender: Female Account #: [id number] Procedure:                Colonoscopy Indications:              Abdominal pain, altered bowel habits (mostly                            constipation) - on Linzess with some                            improvement in bowel habits but not resolution.                            Miralax has not provided benefit and had side                            effects. Patient has a lot of straining. Medicines:                Monitored Anesthesia Care Procedure:                Pre-Anesthesia Assessment:                           - Prior to the procedure, a History and Physical                            was performed, and patient medications and                            allergies were reviewed. The patient's tolerance of                            previous anesthesia was also reviewed. The risks                            and benefits of the procedure and the sedation                            options and risks were discussed with the patient.                            All questions were answered, and informed consent                            was obtained. Prior Anticoagulants: The patient has                            taken no previous anticoagulant or antiplatelet                            agents. ASA Grade Assessment: II - A patient with  mild systemic disease. After reviewing the risks                            and benefits, the patient was deemed in                            satisfactory condition to undergo the procedure.                           After obtaining informed consent, the colonoscope                            was passed under direct vision. Throughout the                            procedure, the patient's blood  pressure, pulse, and                            oxygen saturations were monitored continuously. The                            Olympus S[employer] P[employer] was introduced through the anus                            and advanced to the the terminal ileum, with                            identification of the appendiceal orifice and IC                            valve. The colonoscopy was performed without                            difficulty. The patient tolerated the procedure                            well. The quality of the bowel preparation was                            good. The terminal ileum, ileocecal valve,                            appendiceal orifice, and rectum were photographed. Scope In: 2:28:48 PM Scope Out: 2:43:58 PM Scope Withdrawal Time: 0 hours 12 minutes 51 seconds  Total Procedure Duration: 0 hours 15 minutes 10 seconds  Findings:                 The perianal and digital rectal examinations were                            normal.                           The terminal ileum appeared normal.  The exam was otherwise without abnormality. No                            polyps / inflammatory changes / stricture, etc Complications:            No immediate complications. Estimated blood loss:                            None. Estimated Blood Loss:     Estimated blood loss: none. Impression:               - The examined portion of the ileum was normal.                           - The examination was otherwise normal.                           - No abnormalities appreciated. Recommendation:           - Patient has a contact number available for                            emergencies. The signs and symptoms of potential                            delayed complications were discussed with the                            patient. Return to normal activities tomorrow.                            Written discharge instructions were provided to the                             patient.                           - Resume previous diet.                           - Continue present medications.                           - Discussed options with patient. Trial of pelvic                            floor PT may be reasonable as next step given                            failure of alternatives of laxatives and                            significant straining                           - Repeat colonoscopy age 34 for screening purposes  for screening purposes. Viviann Spare P. Jacquline Terrill, MD 11/19/2020 2:48:47 PM This report has been signed electronically.

## 2020-11-19 NOTE — Progress Notes (Signed)
pt tolerated well. VSS. awake and to recovery. Report given to RN. Bite block inserted and removed without trauma. 

## 2020-11-19 NOTE — Patient Instructions (Signed)
YOU HAD AN ENDOSCOPIC PROCEDURE TODAY AT THE [facility]:   Refer to the procedure report that was given to you for any specific questions about what was found during the examination.  If the procedure report does not answer your questions, please call your gastroenterologist to clarify.  If you requested that your care partner not be given the details of your procedure findings, then the procedure report has been included in a sealed envelope for you to review at your convenience later.  YOU SHOULD EXPECT: Some feelings of bloating in the abdomen. Passage of more gas than usual.  Walking can help get rid of the air that was put into your GI tract during the procedure and reduce the bloating. If you had a lower endoscopy (such as a colonoscopy or flexible sigmoidoscopy) you may notice spotting of blood in your stool or on the toilet paper. If you underwent a bowel prep for your procedure, you may not have a normal bowel movement for a few days.  Please Note:  You might notice some irritation and congestion in your nose or some drainage.  This is from the oxygen used during your procedure.  There is no need for concern and it should clear up in a day or so.  SYMPTOMS TO REPORT IMMEDIATELY:   Following lower endoscopy (colonoscopy or flexible sigmoidoscopy):  Excessive amounts of blood in the stool  Significant tenderness or worsening of abdominal pains  Swelling of the abdomen that is new, acute  Fever of 100F or higher   Following upper endoscopy (EGD)  Vomiting of blood or coffee ground material  New chest pain or pain under the shoulder blades  Painful or persistently difficult swallowing  New shortness of breath  Fever of 100F or higher  Black, tarry-looking stools  For urgent or emergent issues, a gastroenterologist can be reached at any hour by calling [phone]. Do not use MyChart messaging for urgent concerns.    DIET:  We do recommend a small meal at first, but  then you may proceed to your regular diet.  Drink plenty of fluids but you should avoid alcoholic beverages for 24 hours.  MEDICATIONS: Continue present medications.  Follow Up: Trial of pelvic floor PT may be reasonable as the next step given the failure of alternatives of laxatives and significant straining. Dr. Lanetta Inch office nurse will call you to set this up.  ACTIVITY:  You should plan to take it easy for the rest of today and you should NOT DRIVE or use heavy machinery until tomorrow (because of the sedation medicines used during the test).    FOLLOW UP: Our staff will call the number listed on your records 48-72 hours following your procedure to check on you and address any questions or concerns that you may have regarding the information given to you following your procedure. If we do not reach you, we will leave a message.  We will attempt to reach you two times.  During this call, we will ask if you have developed any symptoms of COVID 19. If you develop any symptoms (ie: fever, flu-like symptoms, shortness of breath, cough etc.) before then, please call [phone].  If you test positive for Covid 19 in the 2 weeks post procedure, please call and report this information to [place].    If any biopsies were taken you will be contacted by phone or by letter within the next 1-3 weeks.  Please call us at [phone] if you have not  heard about the biopsies in 3 weeks.   Thank you for allowing [place] to provide for your healthcare needs today.   SIGNATURES/CONFIDENTIALITY: You and/or your care partner have signed paperwork which will be entered into your electronic medical record.  These signatures attest to the fact that that the information above on your After Visit Summary has been reviewed and is understood.  Full responsibility of the confidentiality of this discharge information lies with you and/or your care-partner.

## 2020-11-19 NOTE — Progress Notes (Signed)
Called to room to assist during endoscopic procedure.  Patient ID and intended procedure confirmed with present staff. Received instructions for my participation in the procedure from the performing physician.  

## 2020-11-20 ENCOUNTER — Telehealth: Payer: Self-pay | Admitting: *Deleted

## 2020-11-20 NOTE — Telephone Encounter (Signed)
°  Follow up Call-  Call back number 11/19/2020  Post procedure Call Back phone  # [phone]  Permission to leave phone message Yes  Some recent data might be hidden     Patient questions:  Do you have a fever, pain , or abdominal swelling? No. Pain Score  0 *  Have you tolerated food without any problems? Yes.    Have you been able to return to your normal activities? Yes.    Do you have any questions about your discharge instructions: Diet   No. Medications  No. Follow up visit  No.  Do you have questions or concerns about your Care? No.  Actions: * If pain score is 4 or above: No action needed, pain <4  1. Have you developed a fever since your procedure? NO  2.   Have you had an respiratory symptoms (SOB or cough) since your procedure? NO  3.   Have you tested positive for COVID 19 since your procedure NO  4.   Have you had any family members/close contacts diagnosed with the COVID 19 since your procedure?  NO   If yes to any of these questions please route to Laverna Peace, RN and Karlton Lemon, RN

## 2020-12-02 ENCOUNTER — Other Ambulatory Visit: Payer: Self-pay

## 2020-12-02 DIAGNOSIS — K5909 Other constipation: Secondary | ICD-10-CM

## 2020-12-02 MED ORDER — DICYCLOMINE HCL 10 MG PO CAPS: 10.0000 mg | ORAL_CAPSULE | Freq: Three times a day (TID) | ORAL | 3 refills | Status: AC | PRN

## 2020-12-02 MED ORDER — ONDANSETRON HCL 4 MG PO TABS: 4.0000 mg | ORAL_TABLET | Freq: Four times a day (QID) | ORAL | 3 refills | Status: AC | PRN

## 2020-12-28 DIAGNOSIS — U071 COVID-19: Secondary | ICD-10-CM

## 2020-12-28 HISTORY — DX: COVID-19: U07.1

## 2021-01-20 ENCOUNTER — Ambulatory Visit: Payer: BC Managed Care – PPO | Admitting: Physical Therapy

## 2021-01-20 DIAGNOSIS — B342 Coronavirus infection, unspecified: Secondary | ICD-10-CM | POA: Diagnosis not present

## 2021-03-04 ENCOUNTER — Encounter: Payer: Self-pay | Admitting: Physical Therapy

## 2021-03-04 ENCOUNTER — Other Ambulatory Visit: Payer: Self-pay

## 2021-03-04 ENCOUNTER — Ambulatory Visit: Payer: BC Managed Care – PPO | Attending: Gastroenterology | Admitting: Physical Therapy

## 2021-03-04 DIAGNOSIS — R252 Cramp and spasm: Secondary | ICD-10-CM | POA: Diagnosis not present

## 2021-03-04 DIAGNOSIS — M6281 Muscle weakness (generalized): Secondary | ICD-10-CM | POA: Diagnosis not present

## 2021-03-04 DIAGNOSIS — R279 Unspecified lack of coordination: Secondary | ICD-10-CM

## 2021-03-05 NOTE — Therapy (Signed)
[facility] [address] [place], [place], [address] Phone: [phone]   Fax:  [phone]  Physical Therapy Evaluation  Patient Details  Name: Emily Morton MRN: [id number] Date of Birth: [birth year] Referring Provider (PT): Armbruster, Willaim Rayas, MD   Encounter Date: 03/04/2021   PT End of Session - 03/04/21 1057    Visit Number 1    Date for PT Re-Evaluation 05/27/21    PT Start Time 1022    PT Stop Time 1055    PT Time Calculation (min) 33 min    Activity Tolerance Patient tolerated treatment well    Behavior During Therapy [facility] for tasks assessed/performed           Past Medical History:  Diagnosis Date  . Anxiety   . Depression   . IBS (irritable bowel syndrome)   . PTSD (post-traumatic stress disorder)     Past Surgical History:  Procedure Laterality Date  . ELBOW FRACTURE SURGERY Left    growth plate  . LEEP  2012    There were no vitals filed for this visit.    Subjective Assessment - 03/04/21 1023    Subjective Pt states the last 2 years constipation has gotten a lot worse.  Pt is in the bathroom for 10 min for very small amount.  Pt states she is having pain around the ischial tuberosities and lower abdominal pain before having to go to the bathroom.  I have about 2 good bowel movements per week.  I have straining and small BMs other than that.    Limitations Lifting    Patient Stated Goals have a BM    Currently in Pain? Yes    Pain Score 7    7 on normal day; 10 on bad days   Pain Location Abdomen    Pain Orientation Lower    Pain Type Chronic pain    Pain Radiating Towards pelvic lower pain    Aggravating Factors  lifting things at work sometimes    Pain Relieving Factors anti-spasmotic med, warm tea, lying down    Multiple Pain Sites No              [facility] PT Assessment - 03/05/21 0001      Assessment   Medical Diagnosis K59.09 (ICD-10-CM) - Other constipation    Referring  Provider (PT) Armbruster, Willaim Rayas, MD    Prior Therapy No      Precautions   Precautions None      Balance Screen   Has the patient fallen in the past 6 months No      Home Environment   Living Environment Private residence    Living Arrangements Alone      Prior Function   Level of Independence Independent    Vocation Full time employment    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     Pelvic Floor Special Questions - 03/05/21 0001    Are you Pregnant or attempting pregnancy? No    Currently Sexually Active Yes    Marinoff Scale discomfort that does not affect completion    Urinary Leakage Yes    How often every times sneezing, or holding too long    Activities that cause leaking Sneezing;With strong urge    Urinary urgency No    Urinary frequency every 45 min    Fecal incontinence No    Fluid intake 1 liter    Pelvic Floor Internal Exam pt identity confirmed and internal soft tissue assessment with informed consent    Exam Type Rectal    Palpation tight with very small range of contraction    Strength weak squeeze, no lift    Strength # of reps 1   1/ 10 sec   Strength # of seconds 5    Tone high                      PT Short Term Goals - 03/05/21 1051      PT SHORT TERM GOAL #1   Title ind with diaphragmatic breathing and bulging pelvic floor    Time 4    Period  Weeks    Status New    Target Date 04/01/21      PT SHORT TERM GOAL #2   Title Pt will be ind with initial HEP    Time 4    Period Weeks    Status New    Target Date 04/01/21             PT Long Term Goals - 03/05/21 1044      PT LONG TERM GOAL #1   Title Pt will be ind with advanced HEP    Time 12    Period Weeks    Status New    Target Date 05/27/21      PT LONG TERM GOAL #2   Title Pt will have at least 3 good BMs per week    Baseline 2    Time 12    Period Weeks    Status New    Target Date 05/27/21      PT LONG TERM GOAL #3   Title Pt will have BM in 5 minutes or less due to improved muscle coordination    Baseline 10 minutes    Time 12    Period Weeks    Status New    Target Date 05/27/21      PT LONG TERM GOAL #4   Title Pt will have 6/10 abdominal and pelvic pain at most due to knowledge of how to better manage symptoms    Time 12    Period Weeks    Status New    Target Date 05/27/21                  Plan - 03/04/21 1057    Clinical Impression Statement Pt presents to skilled PT due to chronic constipation that has been getting worse more recently. Pt has pelvic floor with high tone weakness 2/5 as stated above.  She is only able to hold for 5 second and has little awareness of contracting and relaxing.  Pt has a hard time contracting the pelvic floor without compensation using the gluteals.  She was able to bulge a small amount without tightening the sphincter muscles  but not a strong push.  Pt has tension in lumbar and gluteals and hamstrings Lt>Rt.  Pt has some core and hip weakness of 4/5 in the abductors.  Pt will benefit from skilled PT to address these impairments for improved bowel and baldder control and healthy lifestyle.    Personal Factors and Comorbidities Time since onset of injury/illness/exacerbation;Comorbidity 2    Comorbidities PTSD, anxiety/depression    Examination-Activity Limitations Toileting;Continence     Examination-Participation Restrictions Community Activity    Stability/Clinical Decision Making Evolving/Moderate complexity    Clinical Decision Making Moderate    Rehab Potential Excellent    PT Frequency 1x / week    PT Duration 12 weeks    PT Treatment/Interventions ADLs/Self Care Home Management;Biofeedback;Cryotherapy;Electrical Stimulation;Therapeutic activities;Therapeutic exercise;Neuromuscular re-education;Patient/family education;Manual techniques;Passive range of motion;Taping;Dry needling;Moist Heat    PT Next Visit Plan biofeedback for down training and endurance    PT Home Exercise Plan verbally reviewed toileting    Consulted and Agree with Plan of Care Patient           Patient will benefit from skilled therapeutic intervention in order to improve the following deficits and impairments:  Pain,Postural dysfunction,Impaired flexibility,Increased fascial restricitons,Decreased strength,Decreased coordination,Increased muscle spasms  Visit Diagnosis: Muscle weakness (generalized)  Cramp and spasm  Unspecified lack of coordination     Problem List There are no problems to display for this patient.   Brayton Caves Delana Manganello, PT 03/05/2021, 12:05 PM  [facility] [address], [address] [place], [place], [address] Phone: [phone]   Fax:  [phone]  Name: Ondine Gemme MRN: [id number] Date of Birth: 08/08/1986

## 2021-03-11 ENCOUNTER — Ambulatory Visit: Payer: BC Managed Care – PPO | Admitting: Physical Therapy

## 2021-03-11 ENCOUNTER — Other Ambulatory Visit: Payer: Self-pay

## 2021-03-11 DIAGNOSIS — R252 Cramp and spasm: Secondary | ICD-10-CM | POA: Diagnosis not present

## 2021-03-11 DIAGNOSIS — R279 Unspecified lack of coordination: Secondary | ICD-10-CM | POA: Diagnosis not present

## 2021-03-11 DIAGNOSIS — M6281 Muscle weakness (generalized): Secondary | ICD-10-CM | POA: Diagnosis not present

## 2021-03-11 NOTE — Therapy (Signed)
[facility] [address] [place], [place], [address] Phone: [phone]   Fax:  [phone]  Physical Therapy Treatment  Patient Details  Name: Emily Morton MRN: [id number] Date of Birth: 1986-12-13 Referring Provider (PT): Armbruster, Willaim Rayas, MD   Encounter Date: 03/11/2021   PT End of Session - 03/11/21 1501    Visit Number 2    Date for PT Re-Evaluation 05/27/21    PT Start Time 1450    PT Stop Time 1528    PT Time Calculation (min) 38 min    Activity Tolerance Patient tolerated treatment well    Behavior During Therapy [facility] for tasks assessed/performed           Past Medical History:  Diagnosis Date  . Anxiety   . Depression   . IBS (irritable bowel syndrome)   . PTSD (post-traumatic stress disorder)     Past Surgical History:  Procedure Laterality Date  . ELBOW FRACTURE SURGERY Left    growth plate  . LEEP  2012    There were no vitals filed for this visit.   Subjective Assessment - 03/11/21 1504    Subjective This has been a normal week.  The last few days has been loose stools that are small and have to go back and forth.    Limitations Lifting    Patient Stated Goals have a BM    Currently in Pain? Yes    Pain Score 3     Pain Location Abdomen    Pain Orientation Lower    Pain Descriptors / Indicators Cramping    Pain Type Chronic pain    Pain Frequency Intermittent    Multiple Pain Sites No                             [facility] Adult PT Treatment/Exercise - 03/11/21 0001      Neuro Re-ed    Neuro Re-ed Details  biofeedback resting tone in sitting >50mV improved with position; breathing and bulging in supine on wedge, educated and performed stretches with biofeedback      Exercises   Exercises Lumbar      Lumbar Exercises: Stretches   Double Knee to Chest Stretch 60 seconds    Press Ups 60 seconds    Figure 4 Stretch 60 seconds      Manual Therapy   Manual  Therapy Myofascial release    Myofascial Release lower abdomen Rt>Lt cecum and ascending colon                    PT Short Term Goals - 03/11/21 1510      PT SHORT TERM GOAL #1   Title ind with diaphragmatic breathing and bulging pelvic floor    Status On-going      PT SHORT TERM GOAL #2   Title Pt will be ind with initial HEP    Status On-going             PT Long Term Goals - 03/05/21 1044      PT LONG TERM GOAL #1   Title Pt will be ind with advanced HEP    Time 12    Period Weeks    Status New    Target Date 05/27/21      PT LONG TERM GOAL #2   Title Pt will have at least 3 good BMs per week    Baseline 2  Time 12    Period Weeks    Status New    Target Date 05/27/21      PT LONG TERM GOAL #3   Title Pt will have BM in 5 minutes or less due to improved muscle coordination    Baseline 10 minutes    Time 12    Period Weeks    Status New    Target Date 05/27/21      PT LONG TERM GOAL #4   Title Pt will have 6/10 abdominal and pelvic pain at most due to knowledge of how to better manage symptoms    Time 12    Period Weeks    Status New    Target Date 05/27/21                 Plan - 03/11/21 1602    Clinical Impression Statement Today's session focused on down training with biofeedback. Pt did well with lumbar flexion and gluteal stretches more than extension stretch.  Pt also having some lower abdominal cramping and did soft tissue release which helped reduce pelvic floor tone to 4-5mV.  pt will was given initial HEP today and educated on doing breathing along with stretches.  Pt will benefit from skilled PT to continue to work towards function goals    PT Treatment/Interventions ADLs/Self Care Home Management;Biofeedback;Cryotherapy;Electrical Stimulation;Therapeutic activities;Therapeutic exercise;Neuromuscular re-education;Patient/family education;Manual techniques;Passive range of motion;Taping;Dry needling;Moist Heat    PT Next Visit  Plan core strength working improved intra-abdominal pressure for evacuation, TC in sitting for bulging    PT Home Exercise Plan verbally reviewed toileting    Consulted and Agree with Plan of Care Patient           Patient will benefit from skilled therapeutic intervention in order to improve the following deficits and impairments:  Pain,Postural dysfunction,Impaired flexibility,Increased fascial restricitons,Decreased strength,Decreased coordination,Increased muscle spasms  Visit Diagnosis: Muscle weakness (generalized)  Cramp and spasm  Unspecified lack of coordination     Problem List There are no problems to display for this patient.   Brayton Caves Ginna Schuur, PT 03/11/2021, 4:31 PM  [place] [facility] [address], [address] [place], [place], [address] Phone: [phone]   Fax:  [phone]  Name: Emily Morton MRN: [id number] Date of Birth: [birth year]

## 2021-04-03 DIAGNOSIS — M25531 Pain in right wrist: Secondary | ICD-10-CM | POA: Diagnosis not present

## 2021-04-03 DIAGNOSIS — S63501A Unspecified sprain of right wrist, initial encounter: Secondary | ICD-10-CM | POA: Diagnosis not present

## 2021-04-08 DIAGNOSIS — S63501A Unspecified sprain of right wrist, initial encounter: Secondary | ICD-10-CM | POA: Diagnosis not present

## 2021-04-11 DIAGNOSIS — S63501D Unspecified sprain of right wrist, subsequent encounter: Secondary | ICD-10-CM | POA: Diagnosis not present

## 2021-04-14 ENCOUNTER — Other Ambulatory Visit: Payer: Self-pay

## 2021-04-14 ENCOUNTER — Encounter: Payer: Self-pay | Admitting: Physical Therapy

## 2021-04-14 ENCOUNTER — Ambulatory Visit: Payer: BC Managed Care – PPO | Attending: Gastroenterology | Admitting: Physical Therapy

## 2021-04-14 DIAGNOSIS — R252 Cramp and spasm: Secondary | ICD-10-CM | POA: Diagnosis not present

## 2021-04-14 DIAGNOSIS — M6281 Muscle weakness (generalized): Secondary | ICD-10-CM | POA: Diagnosis not present

## 2021-04-14 DIAGNOSIS — R279 Unspecified lack of coordination: Secondary | ICD-10-CM | POA: Diagnosis not present

## 2021-04-14 NOTE — Therapy (Signed)
[facility] [address] [place], [place], [address] Phone: [phone]   Fax:  [phone]  Physical Therapy Treatment  Patient Details  Name: Emily Morton MRN: [id number] Date of Birth: Apr 30, 1986 Referring Provider (PT): Armbruster, Willaim Rayas, MD   Encounter Date: 04/14/2021   PT End of Session - 04/14/21 1405    Visit Number 3    Date for PT Re-Evaluation 05/27/21    PT Start Time 1400    PT Stop Time 1440    PT Time Calculation (min) 40 min    Activity Tolerance Patient tolerated treatment well    Behavior During Therapy [facility] for tasks assessed/performed           Past Medical History:  Diagnosis Date  . Anxiety   . Depression   . IBS (irritable bowel syndrome)   . PTSD (post-traumatic stress disorder)     Past Surgical History:  Procedure Laterality Date  . ELBOW FRACTURE SURGERY Left    growth plate  . LEEP  2012    There were no vitals filed for this visit.   Subjective Assessment - 04/14/21 1404    Subjective I feel like 30-40% better and it feels like it is taking less time to feel like I have fully gone.    Patient Stated Goals have a BM    Currently in Pain? No/denies                             [facility] Adult PT Treatment/Exercise - 04/14/21 0001      Neuro Re-ed    Neuro Re-ed Details  TC to bulge      Lumbar Exercises: Stretches   Press Ups 3 reps;10 seconds    Figure 4 Stretch 60 seconds    Other Lumbar Stretch Exercise child pose 30 sec      Lumbar Exercises: Supine   Bridge 10 reps;5 seconds    Other Supine Lumbar Exercises 90-90 arems overhead 10x      Lumbar Exercises: Quadruped   Single Arm Raise Right;Left;10 reps      Manual Therapy   Manual Therapy Soft tissue mobilization    Soft tissue mobilization adductors, OI, TP sidelying and supine                    PT Short Term Goals - 03/11/21 1510      PT SHORT TERM GOAL #1   Title ind  with diaphragmatic breathing and bulging pelvic floor    Status On-going      PT SHORT TERM GOAL #2   Title Pt will be ind with initial HEP    Status On-going             PT Long Term Goals - 03/05/21 1044      PT LONG TERM GOAL #1   Title Pt will be ind with advanced HEP    Time 12    Period Weeks    Status New    Target Date 05/27/21      PT LONG TERM GOAL #2   Title Pt will have at least 3 good BMs per week    Baseline 2    Time 12    Period Weeks    Status New    Target Date 05/27/21      PT LONG TERM GOAL #3   Title Pt will have BM in 5 minutes or less  due to improved muscle coordination    Baseline 10 minutes    Time 12    Period Weeks    Status New    Target Date 05/27/21      PT LONG TERM GOAL #4   Title Pt will have 6/10 abdominal and pelvic pain at most due to knowledge of how to better manage symptoms    Time 12    Period Weeks    Status New    Target Date 05/27/21                 Plan - 04/14/21 1436    Clinical Impression Statement Pt did well with strength today and able to add core strength.  Pt able to relax after exercises today.  PT has tension Lt >Rt but responded well to STM release to adductors, transverse peroneus, OI bilat.  Pt will benefit from skilled PT to cont to progress strength and ability to hold contraction and relax and bulge after exercises to improve bowel health.    PT Treatment/Interventions ADLs/Self Care Home Management;Biofeedback;Cryotherapy;Electrical Stimulation;Therapeutic activities;Therapeutic exercise;Neuromuscular re-education;Patient/family education;Manual techniques;Passive range of motion;Taping;Dry needling;Moist Heat    PT Next Visit Plan continue core strength and see how this is going, progress as able, pelvic floor endurance with 50% contraction    PT Home Exercise Plan Access Code: [email or web address]    Consulted and Agree with Plan of Care Patient           Patient will benefit from skilled therapeutic  intervention in order to improve the following deficits and impairments:  Pain,Postural dysfunction,Impaired flexibility,Increased fascial restricitons,Decreased strength,Decreased coordination,Increased muscle spasms  Visit Diagnosis: Muscle weakness (generalized)  Cramp and spasm  Unspecified lack of coordination     Problem List There are no problems to display for this patient.   Brayton Caves Yazlynn Birkeland, PT 04/14/2021, 4:04 PM  [place] [facility] [address] [place], [place], [address] Phone: [phone]   Fax:  [phone]  Name: Emily Morton MRN: [id number] Date of Birth: 1986-11-30

## 2021-04-14 NOTE — Patient Instructions (Signed)
Access Code: [email or web address] URL: [email or web address] Date: 04/14/2021 Prepared by: Dwana Curd  Exercises Supine Pelvic Floor Stretch - 1 x daily - 7 x weekly - 1 sets - 3 reps - 30 sec hold Supine Piriformis Stretch - 1 x daily - 7 x weekly - 1 sets - 3 reps - 30 sec hold Cat-Camel to Child's Pose - 1 x daily - 7 x weekly - 1 sets - 5 reps - 10 sec hold Diaphragmatic Breathing in Supported Child's Pose with Pelvic Floor Relaxation - 1 x daily - 7 x weekly - 3 sets - 10 reps

## 2021-04-20 ENCOUNTER — Other Ambulatory Visit: Payer: Self-pay | Admitting: Physician Assistant

## 2021-04-22 ENCOUNTER — Ambulatory Visit: Payer: BC Managed Care – PPO | Admitting: Physical Therapy

## 2021-04-22 ENCOUNTER — Other Ambulatory Visit: Payer: Self-pay

## 2021-04-22 DIAGNOSIS — R279 Unspecified lack of coordination: Secondary | ICD-10-CM | POA: Diagnosis not present

## 2021-04-22 DIAGNOSIS — M6281 Muscle weakness (generalized): Secondary | ICD-10-CM | POA: Diagnosis not present

## 2021-04-22 DIAGNOSIS — R252 Cramp and spasm: Secondary | ICD-10-CM | POA: Diagnosis not present

## 2021-04-22 NOTE — Patient Instructions (Addendum)
  Dorsiflex ankle (activate muscle on the front of your lower leg)  When pushing and bulging the pelvic floor to have a BM  Access Code: [email or web address] URL: [email or web address] Date: 04/22/2021 Prepared by: Dwana Curd  Exercises Supine Pelvic Floor Stretch - 1 x daily - 7 x weekly - 1 sets - 3 reps - 30 sec hold Supine Piriformis Stretch - 1 x daily - 7 x weekly - 1 sets - 3 reps - 30 sec hold Cat-Camel to Child's Pose - 1 x daily - 7 x weekly - 1 sets - 5 reps - 10 sec hold Diaphragmatic Breathing in Supported Child's Pose with Pelvic Floor Relaxation - 1 x daily - 7 x weekly - 3 sets - 10 reps Supine 90/90 Shoulder Flexion with Abdominal Bracing - 1 x daily - 7 x weekly - 3 sets - 10 reps Quadruped Exhale with Pelvic Floor Contraction and Arm Raise - 1 x daily - 7 x weekly - 3 sets - 10 reps Half Kneeling Hip Flexor Stretch with Sidebend - 1 x daily - 7 x weekly - 3 sets - 10 reps Supine Hip Adductor Squeeze with Small Ball - 1 x daily - 7 x weekly - 2 sets - 10 reps - 3 sec hold

## 2021-04-22 NOTE — Therapy (Signed)
[facility] [address], [address] [place], [place], [address] Phone: [phone]   Fax:  [phone]  Physical Therapy Treatment  Patient Details  Name: Emily Morton MRN: [id number] Date of Birth: June 24, 1986 Referring Provider (PT): Armbruster, Willaim Rayas, MD   Encounter Date: 04/22/2021   PT End of Session - 04/22/21 1130    Visit Number 4    Date for PT Re-Evaluation 05/27/21    PT Start Time 1018    PT Stop Time 1100    PT Time Calculation (min) 42 min    Activity Tolerance Patient tolerated treatment well    Behavior During Therapy Emily Morton for tasks assessed/performed           Past Medical History:  Diagnosis Date  . Anxiety   . Depression   . IBS (irritable bowel syndrome)   . PTSD (post-traumatic stress disorder)     Past Surgical History:  Procedure Laterality Date  . ELBOW FRACTURE SURGERY Left    growth plate  . LEEP  2012    There were no vitals filed for this visit.   Subjective Assessment - 04/22/21 1133    Subjective I think things are a little better, the last 2 days have been hard.    Patient Stated Goals have a BM    Currently in Pain? No/denies                             [facility] Adult PT Treatment/Exercise - 04/22/21 0001      Self-Care   Self-Care Other Self-Care Comments    Other Self-Care Comments  using ankle dorsiflexion with breathing and bulging      Lumbar Exercises: Stretches   Other Lumbar Stretch Exercise foam roll lumbar flexion stretch, happy baby, single knee to chest, rocking      Lumbar Exercises: Supine   Clam 20 reps    Other Supine Lumbar Exercises ball squeeze - 2 sec hold; 5 relax - 20x      Manual Therapy   Myofascial Release descending colon and stomach release                  PT Education - 04/22/21 1130    Education Details Access Code: [email or web address]    Person(s) Educated Patient    Methods Explanation;Demonstration;Tactile cues;Verbal  cues;Handout    Comprehension Verbalized understanding;Returned demonstration            PT Short Term Goals - 04/22/21 1040      PT SHORT TERM GOAL #1   Title ind with diaphragmatic breathing and bulging pelvic floor    Status Achieved      PT SHORT TERM GOAL #2   Title Pt will be ind with initial HEP    Status Achieved             PT Long Term Goals - 04/22/21 1041      PT LONG TERM GOAL #1   Title Pt will be ind with advanced HEP    Status On-going      PT LONG TERM GOAL #2   Title Pt will have at least 3 good BMs per week    Baseline 1-2    Status On-going      PT LONG TERM GOAL #3   Title Pt will have BM in 5 minutes or less due to improved muscle coordination    Baseline 10 minutes    Status On-going  PT LONG TERM GOAL #4   Title Pt will have 6/10 abdominal and pelvic pain at most due to knowledge of how to better manage symptoms    Baseline bad day 7-8/10    Status On-going                  Patient will benefit from skilled therapeutic intervention in order to improve the following deficits and impairments:     Visit Diagnosis: Muscle weakness (generalized)  Cramp and spasm  Unspecified lack of coordination     Problem List There are no problems to display for this patient.   Brayton Caves Tyanna Hach, PT 04/22/2021, 11:33 AM  [facility] [address] [place], [place], [address] Phone: [phone]   Fax:  [phone]  Name: Emily Morton MRN: [id number] Date of Birth: 1986-03-16

## 2021-04-23 DIAGNOSIS — S52521D Torus fracture of lower end of right radius, subsequent encounter for fracture with routine healing: Secondary | ICD-10-CM | POA: Diagnosis not present

## 2021-04-23 DIAGNOSIS — M25531 Pain in right wrist: Secondary | ICD-10-CM | POA: Diagnosis not present

## 2021-04-24 DIAGNOSIS — F418 Other specified anxiety disorders: Secondary | ICD-10-CM | POA: Diagnosis not present

## 2021-04-29 ENCOUNTER — Ambulatory Visit: Payer: BC Managed Care – PPO | Attending: Gastroenterology | Admitting: Physical Therapy

## 2021-04-29 ENCOUNTER — Other Ambulatory Visit: Payer: Self-pay

## 2021-04-29 DIAGNOSIS — M6281 Muscle weakness (generalized): Secondary | ICD-10-CM | POA: Diagnosis not present

## 2021-04-29 DIAGNOSIS — R252 Cramp and spasm: Secondary | ICD-10-CM | POA: Diagnosis not present

## 2021-04-29 DIAGNOSIS — R279 Unspecified lack of coordination: Secondary | ICD-10-CM | POA: Diagnosis not present

## 2021-04-29 NOTE — Therapy (Signed)
[facility] [address] [place], [place], [address] Phone: [phone]   Fax:  [phone]  Physical Therapy Treatment  Patient Details  Name: Emily Morton MRN: [id number] Date of Birth: Mar 26, 1986 Referring Provider (PT): Armbruster, Willaim Rayas, MD   Encounter Date: 04/29/2021   PT End of Session - 04/29/21 1017    Visit Number 5    Date for PT Re-Evaluation 05/27/21    PT Start Time 1015    PT Stop Time 1054    PT Time Calculation (min) 39 min    Activity Tolerance Patient tolerated treatment well    Behavior During Therapy [facility] for tasks assessed/performed           Past Medical History:  Diagnosis Date  . Anxiety   . Depression   . IBS (irritable bowel syndrome)   . PTSD (post-traumatic stress disorder)     Past Surgical History:  Procedure Laterality Date  . ELBOW FRACTURE SURGERY Left    growth plate  . LEEP  2012    There were no vitals filed for this visit.   Subjective Assessment - 04/29/21 1237    Subjective I think things are the same since last visit    Patient Stated Goals have a BM    Currently in Pain? No/denies                             [facility] Adult PT Treatment/Exercise - 04/29/21 0001      Lumbar Exercises: Quadruped   Other Quadruped Lumbar Exercises knee elevated breathing and bulge into pelvis      Manual Therapy   Manual therapy comments pt identity confirmed and internal soft tissue release with consent    Soft tissue mobilization anal sphincters and puborectalis; gluteals, lumbar            Trigger Point Dry Needling - 04/29/21 0001    Consent Given? Yes    Education Handout Provided Yes    Muscles Treated Back/Hip Lumbar multifidi;Gluteus minimus;Gluteus medius    Dry Needling Comments bil    Gluteus Minimus Response Twitch response elicited;Palpable increased muscle length    Gluteus Medius Response Twitch response elicited;Palpable increased  muscle length    Lumbar multifidi Response Twitch response elicited;Palpable increased muscle length                PT Education - 04/29/21 1236    Education Details Access Code: [email or web address]    Person(s) Educated Patient    Methods Explanation;Demonstration;Tactile cues;Verbal cues;Handout    Comprehension Verbalized understanding;Returned demonstration            PT Short Term Goals - 04/22/21 1040      PT SHORT TERM GOAL #1   Title ind with diaphragmatic breathing and bulging pelvic floor    Status Achieved      PT SHORT TERM GOAL #2   Title Pt will be ind with initial HEP    Status Achieved             PT Long Term Goals - 04/29/21 1237      PT LONG TERM GOAL #1   Title Pt will be ind with advanced HEP    Status On-going                 Plan - 04/29/21 1055    Clinical Impression Statement Pt responded well to treatment today and felt possibly a little  more relaxed after dry needling, no increased pain. Pt was educated in self stretch to anal sphincter and puborectalis muscles.  Pt is feeling a little better with stretches overall, still about the same symptoms. Pt will benefit from skilled PT to continue to address soft tissue length and coordination    PT Treatment/Interventions ADLs/Self Care Home Management;Biofeedback;Cryotherapy;Electrical Stimulation;Therapeutic activities;Therapeutic exercise;Neuromuscular re-education;Patient/family education;Manual techniques;Passive range of motion;Taping;Dry needling;Moist Heat    PT Next Visit Plan f/u on DN and qped stretch with knee elevated    PT Home Exercise Plan Access Code: [email or web address]    Consulted and Agree with Plan of Care Patient           Patient will benefit from skilled therapeutic intervention in order to improve the following deficits and impairments:  Pain,Postural dysfunction,Impaired flexibility,Increased fascial restricitons,Decreased strength,Decreased coordination,Increased muscle  spasms  Visit Diagnosis: Muscle weakness (generalized)  Cramp and spasm  Unspecified lack of coordination     Problem List There are no problems to display for this patient.   Junious Silk, PT 04/29/2021, 2:05 PM  [facility] [address] [place], [place], [address] Phone: [phone]   Fax:  [phone]  Name: Emily Morton MRN: [id number] Date of Birth: 09-09-1986

## 2021-04-29 NOTE — Patient Instructions (Signed)
Access Code: [email or web address] URL: [email or web address] Date: 04/29/2021 Prepared by: Dwana Curd  Exercises Supine Pelvic Floor Stretch - 1 x daily - 7 x weekly - 1 sets - 3 reps - 30 sec hold Supine Piriformis Stretch - 1 x daily - 7 x weekly - 1 sets - 3 reps - 30 sec hold Cat-Camel to Child's Pose - 1 x daily - 7 x weekly - 1 sets - 5 reps - 10 sec hold Diaphragmatic Breathing in Supported Child's Pose with Pelvic Floor Relaxation - 1 x daily - 7 x weekly - 3 sets - 10 reps Supine 90/90 Shoulder Flexion with Abdominal Bracing - 1 x daily - 7 x weekly - 3 sets - 10 reps Quadruped Exhale with Pelvic Floor Contraction and Arm Raise - 1 x daily - 7 x weekly - 3 sets - 10 reps Half Kneeling Hip Flexor Stretch with Sidebend - 1 x daily - 7 x weekly - 3 sets - 10 reps Supine Hip Adductor Squeeze with Small Ball - 1 x daily - 7 x weekly - 2 sets - 10 reps - 3 sec hold  Patient Education Trigger Point Dry Needling

## 2021-05-06 ENCOUNTER — Other Ambulatory Visit: Payer: Self-pay

## 2021-05-06 ENCOUNTER — Encounter: Payer: Self-pay | Admitting: Physical Therapy

## 2021-05-06 ENCOUNTER — Ambulatory Visit: Payer: BC Managed Care – PPO | Admitting: Physical Therapy

## 2021-05-06 DIAGNOSIS — M6281 Muscle weakness (generalized): Secondary | ICD-10-CM

## 2021-05-06 DIAGNOSIS — R279 Unspecified lack of coordination: Secondary | ICD-10-CM | POA: Diagnosis not present

## 2021-05-06 DIAGNOSIS — R252 Cramp and spasm: Secondary | ICD-10-CM | POA: Diagnosis not present

## 2021-05-06 NOTE — Therapy (Signed)
[facility] [address], [place], [place], [address] Phone: [phone]   Fax:  [phone]  Physical Therapy Treatment  Patient Details  Name: Latreece Mochizuki MRN: [id number] Date of Birth: [birth year] Referring Provider (PT): Armbruster, Carlota Raspberry, MD   Encounter Date: 05/06/2021   PT End of Session - 05/06/21 1020    Visit Number 6    Date for PT Re-Evaluation 05/27/21    PT Start Time 4709    PT Stop Time 1055    PT Time Calculation (min) 40 min    Activity Tolerance Patient tolerated treatment well    Behavior During Therapy [facility] for tasks assessed/performed           Past Medical History:  Diagnosis Date  . Anxiety   . Depression   . IBS (irritable bowel syndrome)   . PTSD (post-traumatic stress disorder)     Past Surgical History:  Procedure Laterality Date  . ELBOW FRACTURE SURGERY Left    growth plate  . LEEP  2012    There were no vitals filed for this visit.   Subjective Assessment - 05/06/21 1019    Subjective I was able to have BM in the morning.  I think the dry needling helped a lot    Patient Stated Goals have a BM    Currently in Pain? No/denies                             [facility] Adult PT Treatment/Exercise - 05/06/21 0001      Lumbar Exercises: Stretches   Other Lumbar Stretch Exercise thoracic rotation sidelying and sitting with flexion    Other Lumbar Stretch Exercise lumbar rotation sitting      Manual Therapy   Soft tissue mobilization glutes lumbar and thoracic paraspinals            Trigger Point Dry Needling - 05/06/21 0001    Consent Given? Yes    Education Handout Provided Previously provided    Muscles Treated Back/Hip Thoracic multifidi    Lumbar multifidi Response Twitch response elicited;Palpable increased muscle length    Thoracic multifidi response Twitch response elicited;Palpable increased muscle length                  PT Short  Term Goals - 04/22/21 1040      PT SHORT TERM GOAL #1   Title ind with diaphragmatic breathing and bulging pelvic floor    Status Achieved      PT SHORT TERM GOAL #2   Title Pt will be ind with initial HEP    Status Achieved             PT Long Term Goals - 05/06/21 1311      PT LONG TERM GOAL #1   Title Pt will be ind with advanced HEP    Status On-going      PT LONG TERM GOAL #2   Title Pt will have at least 3 good BMs per week    Baseline this week was much better and had one every morning 3 days after last treatment    Status Partially Met      PT LONG TERM GOAL #3   Title Pt will have BM in 5 minutes or less due to improved muscle coordination    Status On-going      PT LONG TERM GOAL #4   Title Pt will have 6/10  abdominal and pelvic pain at most due to knowledge of how to better manage symptoms    Status On-going                 Plan - 05/06/21 1308    Clinical Impression Statement Pt responded very well to dry needling last week and had much better/easier BM without needing medication.  Pt had tension and TTP in thoracic and lumbar paraspinals.  DN #2 was given to continue working on maintained muscle length.  pt was given stetches to continue to maintain improved soft tissue.    PT Treatment/Interventions ADLs/Self Care Home Management;Biofeedback;Cryotherapy;Electrical Stimulation;Therapeutic activities;Therapeutic exercise;Neuromuscular re-education;Patient/family education;Manual techniques;Passive range of motion;Taping;Dry needling;Moist Heat    PT Next Visit Plan f/u on DN #2 and qped rotation ex's    PT Home Exercise Plan Access Code: [email or web address]    Consulted and Agree with Plan of Care Patient           Patient will benefit from skilled therapeutic intervention in order to improve the following deficits and impairments:  Pain,Postural dysfunction,Impaired flexibility,Increased fascial restricitons,Decreased strength,Decreased coordination,Increased  muscle spasms  Visit Diagnosis: Muscle weakness (generalized)  Cramp and spasm  Unspecified lack of coordination     Problem List There are no problems to display for this patient.   Camillo Flaming Chelesea Weiand, PT 05/06/2021, 1:14 PM  [facility] [address], [place], [place], [address] Phone: [phone]   Fax:  [phone]  Name: Daria Mcmeekin MRN: [id number] Date of Birth: 31-Jan-1986

## 2021-05-14 ENCOUNTER — Encounter: Payer: Self-pay | Admitting: Physical Therapy

## 2021-05-14 ENCOUNTER — Ambulatory Visit: Payer: BC Managed Care – PPO | Admitting: Physical Therapy

## 2021-05-14 ENCOUNTER — Other Ambulatory Visit: Payer: Self-pay

## 2021-05-14 DIAGNOSIS — M6281 Muscle weakness (generalized): Secondary | ICD-10-CM

## 2021-05-14 DIAGNOSIS — R252 Cramp and spasm: Secondary | ICD-10-CM

## 2021-05-14 DIAGNOSIS — R279 Unspecified lack of coordination: Secondary | ICD-10-CM

## 2021-05-14 NOTE — Therapy (Signed)
[facility] [address], [place], [place], [address] Phone: [phone]   Fax:  [phone]  Physical Therapy Treatment  Patient Details  Name: Marisabel Macpherson MRN: [id number] Date of Birth: 01-21-1986 Referring Provider (PT): Armbruster, Carlota Raspberry, MD   Encounter Date: 05/14/2021   PT End of Session - 05/14/21 1142    Visit Number 7    Date for PT Re-Evaluation 05/27/21    PT Start Time 1104    PT Stop Time 1143    PT Time Calculation (min) 39 min    Activity Tolerance Patient tolerated treatment well    Behavior During Therapy [facility] for tasks assessed/performed           Past Medical History:  Diagnosis Date  . Anxiety   . Depression   . IBS (irritable bowel syndrome)   . PTSD (post-traumatic stress disorder)     Past Surgical History:  Procedure Laterality Date  . ELBOW FRACTURE SURGERY Left    growth plate  . LEEP  2012    There were no vitals filed for this visit.   Subjective Assessment - 05/14/21 1145    Subjective I had good BMs and even went 2x one day which hasn't happened in a while.    Patient Stated Goals have a BM    Currently in Pain? No/denies                             [facility] Adult PT Treatment/Exercise - 05/14/21 0001      Modalities   Modalities Moist Heat;Electrical Stimulation      Moist Heat Therapy   Number Minutes Moist Heat 15 Minutes    Moist Heat Location Lumbar Spine      Electrical Stimulation   Electrical Stimulation Location lumbar    Electrical Stimulation Action IFC    Electrical Stimulation Parameters to tolerance    Electrical Stimulation Goals Pain      Manual Therapy   Soft tissue mobilization lumbar and thoracic paraspinals            Trigger Point Dry Needling - 05/14/21 0001    Consent Given? Yes    Education Handout Provided Previously provided    Lumbar multifidi Response Twitch response elicited;Palpable increased muscle  length    Thoracic multifidi response Twitch response elicited;Palpable increased muscle length                  PT Short Term Goals - 04/22/21 1040      PT SHORT TERM GOAL #1   Title ind with diaphragmatic breathing and bulging pelvic floor    Status Achieved      PT SHORT TERM GOAL #2   Title Pt will be ind with initial HEP    Status Achieved             PT Long Term Goals - 05/14/21 1107      PT LONG TERM GOAL #1   Title Pt will be ind with advanced HEP    Status Partially Met      PT LONG TERM GOAL #2   Title Pt will have at least 3 good BMs per week    Baseline all have been easier with less time; and have been complete in 6-7 minutes    Status Partially Met      PT LONG TERM GOAL #3   Title Pt will have BM in 5 minutes  or less due to improved muscle coordination    Baseline 6 min down from 10 and no straining    Status Partially Met      PT LONG TERM GOAL #4   Title Pt will have 6/10 abdominal and pelvic pain at most due to knowledge of how to better manage symptoms    Baseline 7/10 for a few hours yesterday but due to panic attack                 Plan - 05/14/21 1143    Clinical Impression Statement Pt has made progress as stated in goals.  She is having much better and more frequent BMs in less time.  pt still having some tension in low back and thoracic spine.  Along with manual techniques and home stretches she is expected to meet goals as stated in up to 4 more visits.    PT Treatment/Interventions ADLs/Self Care Home Management;Biofeedback;Cryotherapy;Electrical Stimulation;Therapeutic activities;Therapeutic exercise;Neuromuscular re-education;Patient/family education;Manual techniques;Passive range of motion;Taping;Dry needling;Moist Heat    PT Next Visit Plan f/u on DN #3 and qped rotation ex's in qped if wrist is better; estim with heat    PT Home Exercise Plan Access Code: [email or web address]    Consulted and Agree with Plan of Care Patient            Patient will benefit from skilled therapeutic intervention in order to improve the following deficits and impairments:  Pain,Postural dysfunction,Impaired flexibility,Increased fascial restricitons,Decreased strength,Decreased coordination,Increased muscle spasms  Visit Diagnosis: Muscle weakness (generalized)  Cramp and spasm  Unspecified lack of coordination     Problem List There are no problems to display for this patient.   Camillo Flaming Krishay Faro, PT 05/14/2021, 11:46 AM  [facility] [address] [place], [place], [address] Phone: [phone]   Fax:  [phone]  Name: Lailynn Southgate MRN: [id number] Date of Birth: Aug 31, 1986

## 2021-05-20 ENCOUNTER — Ambulatory Visit: Payer: BC Managed Care – PPO | Admitting: Physical Therapy

## 2021-05-20 ENCOUNTER — Other Ambulatory Visit: Payer: Self-pay

## 2021-05-20 ENCOUNTER — Encounter: Payer: Self-pay | Admitting: Physical Therapy

## 2021-05-20 DIAGNOSIS — R279 Unspecified lack of coordination: Secondary | ICD-10-CM | POA: Diagnosis not present

## 2021-05-20 DIAGNOSIS — M6281 Muscle weakness (generalized): Secondary | ICD-10-CM

## 2021-05-20 DIAGNOSIS — R252 Cramp and spasm: Secondary | ICD-10-CM | POA: Diagnosis not present

## 2021-05-20 NOTE — Therapy (Signed)
[facility] [address], [place], [place], [address] Phone: [phone]   Fax:  [phone]  Physical Therapy Treatment  Patient Details  Name: Jakerra Floyd MRN: [id number] Date of Birth: [birth year] Referring Provider (PT): Armbruster, Carlota Raspberry, MD   Encounter Date: 05/20/2021   PT End of Session - 05/20/21 1121    Visit Number 8    Date for PT Re-Evaluation 05/27/21    PT Start Time 1017    PT Stop Time 1100    PT Time Calculation (min) 43 min    Activity Tolerance Patient tolerated treatment well    Behavior During Therapy [facility] for tasks assessed/performed           Past Medical History:  Diagnosis Date  . Anxiety   . Depression   . IBS (irritable bowel syndrome)   . PTSD (post-traumatic stress disorder)     Past Surgical History:  Procedure Laterality Date  . ELBOW FRACTURE SURGERY Left    growth plate  . LEEP  2012    There were no vitals filed for this visit.   Subjective Assessment - 05/20/21 1129    Subjective I had BMs without straining all week    Patient Stated Goals have a BM    Currently in Pain? No/denies                             [facility] Adult PT Treatment/Exercise - 05/20/21 0001      Neuro Re-ed    Neuro Re-ed Details  squat technique educated and performed      Acupuncturist Location lumbar    Electrical Stimulation Action IFC    Electrical Stimulation Parameters to tolerance    Electrical Stimulation Goals Pain      Manual Therapy   Soft tissue mobilization lumbar and thoracic paraspinals            Trigger Point Dry Needling - 05/20/21 0001    Consent Given? Yes    Education Handout Provided Previously provided    Lumbar multifidi Response Twitch response elicited;Palpable increased muscle length                PT Education - 05/20/21 1045    Education Details educated and performed correct squat  technique    Person(s) Educated Patient            PT Short Term Goals - 04/22/21 1040      PT SHORT TERM GOAL #1   Title ind with diaphragmatic breathing and bulging pelvic floor    Status Achieved      PT SHORT TERM GOAL #2   Title Pt will be ind with initial HEP    Status Achieved             PT Long Term Goals - 05/14/21 1107      PT LONG TERM GOAL #1   Title Pt will be ind with advanced HEP    Status Partially Met      PT LONG TERM GOAL #2   Title Pt will have at least 3 good BMs per week    Baseline all have been easier with less time; and have been complete in 6-7 minutes    Status Partially Met      PT LONG TERM GOAL #3   Title Pt will have BM in 5 minutes or less due to improved muscle coordination  Baseline 6 min down from 10 and no straining    Status Partially Met      PT LONG TERM GOAL #4   Title Pt will have 6/10 abdominal and pelvic pain at most due to knowledge of how to better manage symptoms    Baseline 7/10 for a few hours yesterday but due to panic attack                 Plan - 05/20/21 1122    Clinical Impression Statement Pt responded well to dry needling and STM techniques.  There were fewer trigger points today only in lumbar.  pt has more tension on Rt side.  Pt is using lumbar extensors a lot during squat/lifting techniques.  She was able to correct this and educated in cues to correct when lifting things at work.  Pt will benefit from skilled PT for up to 3-4 more visits to ensure she is lifting correctly without reproducing tension that is causing her constipation.    PT Treatment/Interventions ADLs/Self Care Home Management;Biofeedback;Cryotherapy;Electrical Stimulation;Therapeutic activities;Therapeutic exercise;Neuromuscular re-education;Patient/family education;Manual techniques;Passive range of motion;Taping;Dry needling;Moist Heat    PT Next Visit Plan f/u on DN #4; squat and deadlift; core strength and final HEP if she has met  all goals or re-assess for 2-3 more visits if needed    PT Home Exercise Plan Access Code: [email or web address]    Consulted and Agree with Plan of Care Patient           Patient will benefit from skilled therapeutic intervention in order to improve the following deficits and impairments:  Pain,Postural dysfunction,Impaired flexibility,Increased fascial restricitons,Decreased strength,Decreased coordination,Increased muscle spasms  Visit Diagnosis: Muscle weakness (generalized)  Cramp and spasm  Unspecified lack of coordination     Problem List There are no problems to display for this patient.   Camillo Flaming Mckay Tegtmeyer, PT 05/20/2021, 11:29 AM  [facility] [address] [place], [place], [address] Phone: [phone]   Fax:  [phone]  Name: Shakya Sebring MRN: [id number] Date of Birth: [birth year]

## 2021-05-27 ENCOUNTER — Other Ambulatory Visit: Payer: Self-pay

## 2021-05-27 ENCOUNTER — Ambulatory Visit: Payer: BC Managed Care – PPO | Admitting: Physical Therapy

## 2021-05-27 DIAGNOSIS — R279 Unspecified lack of coordination: Secondary | ICD-10-CM

## 2021-05-27 DIAGNOSIS — R252 Cramp and spasm: Secondary | ICD-10-CM | POA: Diagnosis not present

## 2021-05-27 DIAGNOSIS — M6281 Muscle weakness (generalized): Secondary | ICD-10-CM

## 2021-05-27 NOTE — Therapy (Addendum)
[facility] [address], [place], [place], [address] Phone: [phone]   Fax:  [phone]  Physical Therapy Treatment  Patient Details  Name: Agam Tuohy MRN: [id number] Date of Birth: 1986/12/09 Referring Provider (PT): Armbruster, Carlota Raspberry, MD   Encounter Date: 05/27/2021   PT End of Session - 05/27/21 1051     Visit Number 9    Date for PT Re-Evaluation 05/27/21    PT Start Time 1016    PT Stop Time 1053    PT Time Calculation (min) 37 min    Activity Tolerance Patient tolerated treatment well    Behavior During Therapy [facility] for tasks assessed/performed             Past Medical History:  Diagnosis Date   Anxiety    Depression    IBS (irritable bowel syndrome)    PTSD (post-traumatic stress disorder)     Past Surgical History:  Procedure Laterality Date   ELBOW FRACTURE SURGERY Left    growth plate   LEEP  1937    There were no vitals filed for this visit.   Subjective Assessment - 05/27/21 1051     Subjective I had BMs without straining all week, no abdominal pain and I feel better lifting at work    Currently in Pain? No/denies                               [facility] Adult PT Treatment/Exercise - 05/27/21 0001       Moist Heat Therapy   Number Minutes Moist Heat 15 Minutes    Moist Heat Location Lumbar Spine      Electrical Stimulation   Electrical Stimulation Location lumbar    Electrical Stimulation Action IFC    Electrical Stimulation Parameters to tolerance    Electrical Stimulation Goals Pain      Manual Therapy   Soft tissue mobilization lumbar and thoracic paraspinals              Trigger Point Dry Needling - 05/27/21 0001     Consent Given? Yes    Education Handout Provided Previously provided    Lumbar multifidi Response Twitch response elicited;Palpable increased muscle length    Thoracic multifidi response Twitch response elicited;Palpable  increased muscle length                    PT Short Term Goals - 04/22/21 1040       PT SHORT TERM GOAL #1   Title ind with diaphragmatic breathing and bulging pelvic floor    Status Achieved      PT SHORT TERM GOAL #2   Title Pt will be ind with initial HEP    Status Achieved               PT Long Term Goals - 05/27/21 1050       PT LONG TERM GOAL #1   Title Pt will be ind with advanced HEP    Status Achieved      PT LONG TERM GOAL #2   Title Pt will have at least 3 good BMs per week    Baseline daily    Status Achieved      PT LONG TERM GOAL #3   Title Pt will have BM in 5 minutes or less due to improved muscle coordination    Status Achieved      PT LONG  TERM GOAL #4   Title Pt will have 6/10 abdominal and pelvic pain at most due to knowledge of how to better manage symptoms    Baseline no pain from constipation    Status Achieved                   Plan - 05/27/21 1049     Clinical Impression Statement Pt is doing well and has had regular BM without straining. Pt is ind with HEP and will d/c today    PT Treatment/Interventions ADLs/Self Care Home Management;Biofeedback;Cryotherapy;Electrical Stimulation;Therapeutic activities;Therapeutic exercise;Neuromuscular re-education;Patient/family education;Manual techniques;Passive range of motion;Taping;Dry needling;Moist Heat    PT Next Visit Plan d/c today    PT Home Exercise Plan Access Code: [email or web address]    Consulted and Agree with Plan of Care Patient             Patient will benefit from skilled therapeutic intervention in order to improve the following deficits and impairments:  Pain,Postural dysfunction,Impaired flexibility,Increased fascial restricitons,Decreased strength,Decreased coordination,Increased muscle spasms  Visit Diagnosis: Muscle weakness (generalized)  Cramp and spasm  Unspecified lack of coordination     Problem List There are no problems to display for this  patient.   Camillo Flaming Claude Swendsen, PT 05/27/2021, 10:52 AM  [facility] [address] [place], [place], [address] Phone: [phone]   Fax:  [phone]  Name: Alin Hutchins MRN: [id number] Date of Birth: 04-Mar-1986  PHYSICAL THERAPY DISCHARGE SUMMARY  Visits from Start of Care: 9  Current functional level related to goals / functional outcomes: See above goals met   Remaining deficits: See above   Education / Equipment: HEP  Patient agrees to discharge. Patient goals were met. Patient is being discharged due to meeting the stated rehab goals.  Gustavus Bryant, PT 06/09/21 9:33 AM

## 2021-05-28 DIAGNOSIS — Z8781 Personal history of (healed) traumatic fracture: Secondary | ICD-10-CM | POA: Diagnosis not present

## 2021-05-28 DIAGNOSIS — S52521D Torus fracture of lower end of right radius, subsequent encounter for fracture with routine healing: Secondary | ICD-10-CM | POA: Diagnosis not present

## 2021-05-28 DIAGNOSIS — M25531 Pain in right wrist: Secondary | ICD-10-CM | POA: Diagnosis not present

## 2021-06-03 ENCOUNTER — Encounter: Payer: BC Managed Care – PPO | Admitting: Physical Therapy

## 2021-06-24 DIAGNOSIS — F418 Other specified anxiety disorders: Secondary | ICD-10-CM | POA: Diagnosis not present

## 2021-11-05 DIAGNOSIS — J069 Acute upper respiratory infection, unspecified: Secondary | ICD-10-CM | POA: Diagnosis not present

## 2021-12-20 DIAGNOSIS — R519 Headache, unspecified: Secondary | ICD-10-CM | POA: Diagnosis not present

## 2021-12-20 DIAGNOSIS — R0981 Nasal congestion: Secondary | ICD-10-CM | POA: Diagnosis not present

## 2021-12-20 DIAGNOSIS — Z20822 Contact with and (suspected) exposure to covid-19: Secondary | ICD-10-CM | POA: Diagnosis not present

## 2021-12-20 DIAGNOSIS — R509 Fever, unspecified: Secondary | ICD-10-CM | POA: Diagnosis not present

## 2021-12-20 DIAGNOSIS — R0989 Other specified symptoms and signs involving the circulatory and respiratory systems: Secondary | ICD-10-CM | POA: Diagnosis not present

## 2021-12-20 DIAGNOSIS — J029 Acute pharyngitis, unspecified: Secondary | ICD-10-CM | POA: Diagnosis not present

## 2021-12-20 DIAGNOSIS — R058 Other specified cough: Secondary | ICD-10-CM | POA: Diagnosis not present

## 2021-12-23 DIAGNOSIS — R87810 Cervical high risk human papillomavirus (HPV) DNA test positive: Secondary | ICD-10-CM | POA: Diagnosis not present

## 2021-12-23 DIAGNOSIS — Z01419 Encounter for gynecological examination (general) (routine) without abnormal findings: Secondary | ICD-10-CM | POA: Diagnosis not present

## 2021-12-23 DIAGNOSIS — Z6823 Body mass index (BMI) 23.0-23.9, adult: Secondary | ICD-10-CM | POA: Diagnosis not present

## 2021-12-31 DIAGNOSIS — Z0000 Encounter for general adult medical examination without abnormal findings: Secondary | ICD-10-CM | POA: Diagnosis not present

## 2021-12-31 DIAGNOSIS — Z23 Encounter for immunization: Secondary | ICD-10-CM | POA: Diagnosis not present

## 2021-12-31 DIAGNOSIS — R41840 Attention and concentration deficit: Secondary | ICD-10-CM | POA: Diagnosis not present

## 2021-12-31 DIAGNOSIS — Z13220 Encounter for screening for lipoid disorders: Secondary | ICD-10-CM | POA: Diagnosis not present

## 2021-12-31 DIAGNOSIS — F418 Other specified anxiety disorders: Secondary | ICD-10-CM | POA: Diagnosis not present

## 2022-01-16 DIAGNOSIS — N871 Moderate cervical dysplasia: Secondary | ICD-10-CM | POA: Diagnosis not present

## 2022-01-16 DIAGNOSIS — R87810 Cervical high risk human papillomavirus (HPV) DNA test positive: Secondary | ICD-10-CM | POA: Diagnosis not present

## 2022-02-05 DIAGNOSIS — D069 Carcinoma in situ of cervix, unspecified: Secondary | ICD-10-CM | POA: Diagnosis not present

## 2022-02-05 DIAGNOSIS — N870 Mild cervical dysplasia: Secondary | ICD-10-CM | POA: Diagnosis not present

## 2022-02-05 DIAGNOSIS — Z3202 Encounter for pregnancy test, result negative: Secondary | ICD-10-CM | POA: Diagnosis not present

## 2022-02-27 DIAGNOSIS — N870 Mild cervical dysplasia: Secondary | ICD-10-CM | POA: Diagnosis not present

## 2022-02-27 DIAGNOSIS — Z09 Encounter for follow-up examination after completed treatment for conditions other than malignant neoplasm: Secondary | ICD-10-CM | POA: Diagnosis not present

## 2022-03-05 HISTORY — PX: LEEP: SHX91

## 2022-04-01 ENCOUNTER — Other Ambulatory Visit: Payer: Self-pay

## 2022-04-01 ENCOUNTER — Encounter (HOSPITAL_BASED_OUTPATIENT_CLINIC_OR_DEPARTMENT_OTHER): Payer: Self-pay | Admitting: Obstetrics & Gynecology

## 2022-04-01 NOTE — Progress Notes (Signed)
? ? Your procedure is scheduled on Wednesday, 04/22/22. ? Report to [facility] AT  11:15 A. M. ? ? Call this number if you have problems the morning of surgery  :[phone]. ? ? OUR ADDRESS IS [address].  WE ARE LOCATED IN [facility]. ? ?PLEASE BRING YOUR INSURANCE CARD AND PHOTO ID DAY OF SURGERY. ? ?ONLY 2 PEOPLE ARE ALLOWED IN  WAITING  ROOM.  ?                                   ? REMEMBER: ? DO NOT EAT FOOD, CANDY GUM OR MINTS  AFTER MIDNIGHT THE NIGHT BEFORE YOUR SURGERY . YOU MAY HAVE CLEAR LIQUIDS FROM MIDNIGHT THE NIGHT BEFORE YOUR SURGERY UNTIL  10:15 am. NO CLEAR LIQUIDS AFTER   10:15 am DAY OF SURGERY. ? ?YOU MAY  BRUSH YOUR TEETH MORNING OF SURGERY AND RINSE YOUR MOUTH OUT, NO CHEWING GUM CANDY OR MINTS. ? ? ? ? ?CLEAR LIQUID DIET ? ? ?Foods Allowed                                                                     Foods Excluded ? ?Coffee and tea, regular and decaf                             liquids that you cannot  ?Plain Jell-O                                                                   see through such as: ?Fruit ices (not with fruit pulp)                                     milk, soups, orange juice  ?Plain  Popsicles                                    All solid food ?Carbonated beverages, regular and diet                                    ?Cranberry, grape and apple juices ?Sports drinks like Gatorade ? ?Sample Menu ?Breakfast                                Lunch                                     Supper ?Cranberry juice                                           ?  Jell-O                                     Grape juice                           Apple juice ?Coffee or tea                        Jell-O                                      Popsicle ?                                               Coffee or tea                        Coffee or tea ? ?_____________________________________________________________________ ?  ? ? TAKE THESE  MEDICATIONS MORNING OF SURGERY: Pristiq, Zofran if needed, Bentyl if needed, Xanax if needed ? ? ? UP TO 4 VISITORS  MAY VISIT IN THE EXTENDED RECOVERY ROOM UNTIL 800 PM ONLY.  1 VISITOR AGE 47 AND OVER MAY SPEND THE NIGHT AND MUST BE IN EXTENDED RECOVERY ROOM NO LATER THAN 800 PM . YOUR DISCHARGE TIME AFTER YOU SPEND THE NIGHT IS 900 AM THE MORNING AFTER YOUR SURGERY. ? ?YOU MAY PACK A SMALL OVERNIGHT BAG WITH TOILETRIES FOR YOUR OVERNIGHT STAY IF YOU WISH. ? ?YOUR PRESCRIPTION MEDICATIONS WILL BE PROVIDED DURING YOUR HOSPITAL STAY. ? ? ?                                   ?DO NOT WEAR JEWERLY, MAKE UP. ?DO NOT WEAR LOTIONS, POWDERS, PERFUMES OR NAIL POLISH ON YOUR FINGERNAILS. TOENAIL POLISH IS OK TO WEAR. ?DO NOT SHAVE FOR 48 HOURS PRIOR TO DAY OF SURGERY. ?MEN MAY SHAVE FACE AND NECK. ?CONTACTS, GLASSES, OR DENTURES MAY NOT BE WORN TO SURGERY. ? ?REMEMBER: NO SMOKING, DRUGS OR ALCOHOL FOR 24 HOURS BEFORE YOUR SURGERY. ?                                   ?[facility] IS NOT RESPONSIBLE  FOR ANY BELONGINGS.                                  ?                                  . ?          [place] - Preparing for Surgery ?Before surgery, you can play an important role.  Because skin is not sterile, your skin needs to be as free of germs as possible.  You can reduce the number of germs on your skin by washing with CHG (chlorahexidine gluconate) soap before surgery.  CHG is an antiseptic cleaner which kills  germs and bonds with the skin to continue killing germs even after washing. ?Please DO NOT use if you have an allergy to CHG or antibacterial soaps.  If your skin becomes reddened/irritated stop using the CHG and inform your nurse when you arrive at Short Stay. ?Do not shave (including legs and underarms) for at least 48 hours prior to the first CHG shower.  You may shave your face/neck. ?Please follow these instructions carefully: ? 1.  Shower with CHG Soap the night before surgery and the  morning of  Surgery. ? 2.  If you choose to wash your hair, wash your hair first as usual with your  normal  shampoo. ? 3.  After you shampoo, rinse your hair and body thoroughly to remove the  shampoo.                            ?4.  Use CHG as you would any other liquid soap.  You can apply chg directly  to the skin and wash  ?                    Gently with a scrungie or clean washcloth. ? 5.  Apply the CHG Soap to your body ONLY FROM THE NECK DOWN.   Do not use on face/ open      ?                     Wound or open sores. Avoid contact with eyes, ears mouth and genitals (private parts).  ?                     Engineering geologistWash face,  Genitals (private parts) with your normal soap. ?            6.  Wash thoroughly, paying special attention to the area where your surgery  will be performed. ? 7.  Thoroughly rinse your body with warm water from the neck down. ? 8.  DO NOT shower/wash with your normal soap after using and rinsing off  the CHG Soap. ?               9.  Pat yourself dry with a clean towel. ?           10.  Wear clean pajamas. ?           11.  Place clean sheets on your bed the night of your first shower and do not  sleep with pets. ?Day of Surgery : ?Do not apply any lotions/deodorants the morning of surgery.  Please wear clean clothes to the hospital/surgery center. ? ?IF YOU HAVE ANY SKIN IRRITATION OR PROBLEMS WITH THE SURGICAL SOAP, PLEASE GET A BAR OF GOLD DIAL SOAP AND SHOWER THE NIGHT BEFORE YOUR SURGERY AND THE MORNING OF YOUR SURGERY. PLEASE LET THE NURSE KNOW MORNING OF YOUR SURGERY IF YOU HAD ANY PROBLEMS WITH THE SURGICAL SOAP. ? ? ?________________________________________________________________________                  ?                                    ?  QUESTIONS CALL Yvon Mccord PRE OP NURSE PHONE [phone].                                    ?

## 2022-04-01 NOTE — Progress Notes (Signed)
Spoke w/ patient via phone for pre-op interview---Taraya ?Lab needs dos---- urine pregnancy POCT per anesthesia, surgeon orders pending as of 04/01/22              ?Lab results------04/20/22 lab appt for CBC, type & screen ?COVID test -----patient states asymptomatic no test needed ?Arrive at -------1115 on Wednesday, April 22, 2022 ?NPO after MN NO Solid Food.  Clear liquids from MN until---1015 ?Med rec completed ?Medications to take morning of surgery -----Pristiq, Xanax prn, Bentyl prn, Zofran prn ?Diabetic medication -----n/a ?Patient instructed no nail polish to be worn day of surgery ?Patient instructed to bring photo id and insurance card day of surgery ?Patient aware to have Driver (ride ) / caregiver    for 24 hours after surgery - sister, Faith ?Patient Special Instructions -----Extended recovery instructions given.  ?Pre-Op special Istructions -----Patient wears Invisalign, but stated she will leave them at home.  Requested orders from Dr. Juliene Pina on 04/01/22 via Epic IB. ?Patient verbalized understanding of instructions that were given at this phone interview. ?Patient denies shortness of breath, chest pain, fever, cough at this phone interview.  ?

## 2022-04-02 ENCOUNTER — Other Ambulatory Visit: Payer: Self-pay | Admitting: Obstetrics & Gynecology

## 2022-04-20 ENCOUNTER — Encounter (HOSPITAL_COMMUNITY)
Admission: RE | Admit: 2022-04-20 | Discharge: 2022-04-20 | Disposition: A | Discharge status: Home / Self Care | Payer: BC Managed Care – PPO | Source: Ambulatory Visit | Attending: Obstetrics & Gynecology | Admitting: Obstetrics & Gynecology

## 2022-04-20 DIAGNOSIS — F902 Attention-deficit hyperactivity disorder, combined type: Secondary | ICD-10-CM | POA: Diagnosis not present

## 2022-04-20 DIAGNOSIS — Z79899 Other long term (current) drug therapy: Secondary | ICD-10-CM | POA: Diagnosis not present

## 2022-04-20 DIAGNOSIS — F419 Anxiety disorder, unspecified: Secondary | ICD-10-CM | POA: Diagnosis not present

## 2022-04-20 DIAGNOSIS — Z01812 Encounter for preprocedural laboratory examination: Secondary | ICD-10-CM | POA: Diagnosis not present

## 2022-04-20 DIAGNOSIS — R41840 Attention and concentration deficit: Secondary | ICD-10-CM | POA: Diagnosis not present

## 2022-04-20 LAB — BASIC METABOLIC PANEL
Anion gap: 5 (ref 5–15)
BUN: 18 mg/dL (ref 6–20)
CO2: 25 mmol/L (ref 22–32)
Calcium: 8.9 mg/dL (ref 8.9–10.3)
Chloride: 109 mmol/L (ref 98–111)
Creatinine, Ser: 0.59 mg/dL (ref 0.44–1.00)
GFR, Estimated: >60 mL/min (ref >60)
Glucose, Bld: 92 mg/dL (ref 70–99)
Potassium: 4.1 mmol/L (ref 3.5–5.1)
Sodium: 139 mmol/L (ref 135–145)

## 2022-04-20 LAB — CBC
HCT: 40.8 % (ref 36.0–46.0)
Hemoglobin: 13.7 g/dL (ref 12.0–15.0)
MCH: 32.2 pg (ref 26.0–34.0)
MCHC: 33.6 g/dL (ref 30.0–36.0)
MCV: 96 fL (ref 80.0–100.0)
Platelets: 199 10*3/uL (ref 150–400)
RBC: 4.25 MIL/uL (ref 3.87–5.11)
RDW: 12.3 % (ref 11.5–15.5)
WBC: 7.1 10*3/uL (ref 4.0–10.5)
nRBC: 0 % (ref 0.0–0.2)

## 2022-04-22 ENCOUNTER — Ambulatory Visit (HOSPITAL_BASED_OUTPATIENT_CLINIC_OR_DEPARTMENT_OTHER): Payer: BC Managed Care – PPO | Admitting: Anesthesiology

## 2022-04-22 ENCOUNTER — Encounter (HOSPITAL_BASED_OUTPATIENT_CLINIC_OR_DEPARTMENT_OTHER)
Admission: RE | Disposition: A | Discharge status: Home / Self Care | Payer: Self-pay | Source: Home / Self Care | Attending: Obstetrics & Gynecology

## 2022-04-22 ENCOUNTER — Encounter (HOSPITAL_BASED_OUTPATIENT_CLINIC_OR_DEPARTMENT_OTHER): Payer: Self-pay | Admitting: Obstetrics & Gynecology

## 2022-04-22 ENCOUNTER — Ambulatory Visit (HOSPITAL_BASED_OUTPATIENT_CLINIC_OR_DEPARTMENT_OTHER)
Admission: RE | Admit: 2022-04-22 | Discharge: 2022-04-23 | Disposition: A | Discharge status: Home / Self Care | Payer: BC Managed Care – PPO | Attending: Obstetrics & Gynecology | Admitting: Obstetrics & Gynecology

## 2022-04-22 ENCOUNTER — Other Ambulatory Visit: Payer: Self-pay

## 2022-04-22 DIAGNOSIS — F418 Other specified anxiety disorders: Secondary | ICD-10-CM | POA: Diagnosis not present

## 2022-04-22 DIAGNOSIS — Z87891 Personal history of nicotine dependence: Secondary | ICD-10-CM | POA: Insufficient documentation

## 2022-04-22 DIAGNOSIS — Z01818 Encounter for other preprocedural examination: Secondary | ICD-10-CM

## 2022-04-22 DIAGNOSIS — N871 Moderate cervical dysplasia: Secondary | ICD-10-CM | POA: Insufficient documentation

## 2022-04-22 DIAGNOSIS — Z90710 Acquired absence of both cervix and uterus: Secondary | ICD-10-CM | POA: Diagnosis present

## 2022-04-22 DIAGNOSIS — N870 Mild cervical dysplasia: Secondary | ICD-10-CM | POA: Diagnosis present

## 2022-04-22 DIAGNOSIS — Z803 Family history of malignant neoplasm of breast: Secondary | ICD-10-CM | POA: Diagnosis not present

## 2022-04-22 HISTORY — PX: ROBOTIC ASSISTED LAPAROSCOPIC HYSTERECTOMY AND SALPINGECTOMY: SHX6379

## 2022-04-22 LAB — TYPE AND SCREEN
ABO/RH(D): A NEG
Antibody Screen: NEGATIVE

## 2022-04-22 LAB — POCT PREGNANCY, URINE: Preg Test, Ur: NEGATIVE

## 2022-04-22 SURGERY — XI ROBOTIC ASSISTED LAPAROSCOPIC HYSTERECTOMY AND SALPINGECTOMY
Anesthesia: General | Site: Pelvis | Laterality: Bilateral

## 2022-04-22 MED ORDER — FENTANYL CITRATE (PF) 100 MCG/2ML IJ SOLN
INTRAMUSCULAR | Status: AC
  Filled 2022-04-22: qty 2

## 2022-04-22 MED ORDER — OXYCODONE HCL 5 MG PO TABS
5.0000 mg | ORAL_TABLET | ORAL | Status: DC | PRN
  Administered 2022-04-22 – 2022-04-23 (×4): 5 mg via ORAL

## 2022-04-22 MED ORDER — IBUPROFEN 200 MG PO TABS: 600.0000 mg | ORAL_TABLET | Freq: Four times a day (QID) | ORAL | Status: DC

## 2022-04-22 MED ORDER — FENTANYL CITRATE (PF) 100 MCG/2ML IJ SOLN
INTRAMUSCULAR | Status: DC | PRN
  Administered 2022-04-22 (×2): 50 ug via INTRAVENOUS

## 2022-04-22 MED ORDER — FENTANYL CITRATE (PF) 100 MCG/2ML IJ SOLN
25.0000 ug | INTRAMUSCULAR | Status: DC | PRN
  Administered 2022-04-22: 25 ug via INTRAVENOUS

## 2022-04-22 MED ORDER — SIMETHICONE 80 MG PO CHEW
80.0000 mg | CHEWABLE_TABLET | Freq: Four times a day (QID) | ORAL | Status: DC
  Administered 2022-04-22 – 2022-04-23 (×3): 80 mg via ORAL

## 2022-04-22 MED ORDER — SODIUM CHLORIDE 0.9 % IR SOLN
Status: DC | PRN
  Administered 2022-04-22: 300 mL

## 2022-04-22 MED ORDER — ONDANSETRON HCL 4 MG/2ML IJ SOLN
INTRAMUSCULAR | Status: AC
Start: 2022-04-22 — End: ?
  Filled 2022-04-22: qty 2

## 2022-04-22 MED ORDER — ACETAMINOPHEN 500 MG PO TABS
ORAL_TABLET | ORAL | Status: AC
  Filled 2022-04-22: qty 2

## 2022-04-22 MED ORDER — PHENYLEPHRINE HCL (PRESSORS) 10 MG/ML IV SOLN
INTRAVENOUS | Status: DC | PRN
  Administered 2022-04-22: 80 ug via INTRAVENOUS

## 2022-04-22 MED ORDER — OXYCODONE HCL 5 MG PO TABS: 5.0000 mg | ORAL_TABLET | Freq: Once | ORAL | Status: DC | PRN

## 2022-04-22 MED ORDER — MIDAZOLAM HCL 2 MG/2ML IJ SOLN
INTRAMUSCULAR | Status: AC
  Filled 2022-04-22: qty 2

## 2022-04-22 MED ORDER — KETAMINE HCL 10 MG/ML IJ SOLN
INTRAMUSCULAR | Status: DC | PRN
  Administered 2022-04-22 (×3): 10 mg via INTRAVENOUS

## 2022-04-22 MED ORDER — OXYCODONE HCL 5 MG PO TABS
ORAL_TABLET | ORAL | Status: AC
  Filled 2022-04-22: qty 1

## 2022-04-22 MED ORDER — POVIDONE-IODINE 10 % EX SWAB: 2.0000 "application " | Freq: Once | CUTANEOUS | Status: DC

## 2022-04-22 MED ORDER — ONDANSETRON HCL 4 MG/2ML IJ SOLN: 4.0000 mg | Freq: Four times a day (QID) | INTRAMUSCULAR | Status: DC | PRN

## 2022-04-22 MED ORDER — SUGAMMADEX SODIUM 200 MG/2ML IV SOLN
INTRAVENOUS | Status: DC | PRN
  Administered 2022-04-22: 200 mg via INTRAVENOUS

## 2022-04-22 MED ORDER — ONDANSETRON HCL 4 MG PO TABS: 4.0000 mg | ORAL_TABLET | Freq: Four times a day (QID) | ORAL | Status: DC | PRN

## 2022-04-22 MED ORDER — DEXAMETHASONE SODIUM PHOSPHATE 4 MG/ML IJ SOLN
INTRAMUSCULAR | Status: DC | PRN
  Administered 2022-04-22: 10 mg via INTRAVENOUS

## 2022-04-22 MED ORDER — ONDANSETRON HCL 4 MG/2ML IJ SOLN
INTRAMUSCULAR | Status: DC | PRN
  Administered 2022-04-22: 4 mg via INTRAVENOUS

## 2022-04-22 MED ORDER — ROCURONIUM BROMIDE 10 MG/ML (PF) SYRINGE
PREFILLED_SYRINGE | INTRAVENOUS | Status: AC
  Filled 2022-04-22: qty 10

## 2022-04-22 MED ORDER — LACTATED RINGERS IV SOLN: INTRAVENOUS | Status: DC

## 2022-04-22 MED ORDER — CEFAZOLIN SODIUM-DEXTROSE 2-4 GM/100ML-% IV SOLN
2.0000 g | INTRAVENOUS | Status: AC
  Administered 2022-04-22: 2 g via INTRAVENOUS

## 2022-04-22 MED ORDER — KETOROLAC TROMETHAMINE 30 MG/ML IJ SOLN
30.0000 mg | Freq: Four times a day (QID) | INTRAMUSCULAR | Status: DC
  Administered 2022-04-22 – 2022-04-23 (×2): 30 mg via INTRAVENOUS

## 2022-04-22 MED ORDER — FENTANYL CITRATE (PF) 100 MCG/2ML IJ SOLN: 25.0000 ug | INTRAMUSCULAR | Status: DC | PRN

## 2022-04-22 MED ORDER — SODIUM CHLORIDE 0.9 % IV SOLN
INTRAVENOUS | Status: DC | PRN
  Administered 2022-04-22: 12 mL

## 2022-04-22 MED ORDER — KETAMINE HCL 50 MG/5ML IJ SOSY
PREFILLED_SYRINGE | INTRAMUSCULAR | Status: AC
  Filled 2022-04-22: qty 5

## 2022-04-22 MED ORDER — HYDROMORPHONE HCL 1 MG/ML IJ SOLN
0.5000 mg | INTRAMUSCULAR | Status: DC | PRN
  Administered 2022-04-22: 0.5 mg via INTRAVENOUS

## 2022-04-22 MED ORDER — MENTHOL 3 MG MT LOZG: 1.0000 | LOZENGE | OROMUCOSAL | Status: DC | PRN

## 2022-04-22 MED ORDER — DEXAMETHASONE SODIUM PHOSPHATE 10 MG/ML IJ SOLN
INTRAMUSCULAR | Status: AC
  Filled 2022-04-22: qty 1

## 2022-04-22 MED ORDER — SIMETHICONE 80 MG PO CHEW
CHEWABLE_TABLET | ORAL | Status: AC
  Filled 2022-04-22: qty 1

## 2022-04-22 MED ORDER — KETOROLAC TROMETHAMINE 30 MG/ML IJ SOLN
INTRAMUSCULAR | Status: DC | PRN
  Administered 2022-04-22: 30 mg via INTRAVENOUS

## 2022-04-22 MED ORDER — MIDAZOLAM HCL 5 MG/5ML IJ SOLN
INTRAMUSCULAR | Status: DC | PRN
  Administered 2022-04-22: 2 mg via INTRAVENOUS

## 2022-04-22 MED ORDER — CEFAZOLIN SODIUM-DEXTROSE 2-4 GM/100ML-% IV SOLN
INTRAVENOUS | Status: AC
  Filled 2022-04-22: qty 100

## 2022-04-22 MED ORDER — ACETAMINOPHEN 325 MG PO TABS
650.0000 mg | ORAL_TABLET | ORAL | Status: DC | PRN
  Administered 2022-04-23: 650 mg via ORAL

## 2022-04-22 MED ORDER — PROPOFOL 10 MG/ML IV BOLUS
INTRAVENOUS | Status: AC
Start: 2022-04-22 — End: ?
  Filled 2022-04-22: qty 20

## 2022-04-22 MED ORDER — KETOROLAC TROMETHAMINE 30 MG/ML IJ SOLN
INTRAMUSCULAR | Status: AC
  Filled 2022-04-22: qty 1

## 2022-04-22 MED ORDER — ACETAMINOPHEN 500 MG PO TABS
1000.0000 mg | ORAL_TABLET | Freq: Once | ORAL | Status: AC
  Administered 2022-04-22: 1000 mg via ORAL

## 2022-04-22 MED ORDER — LIDOCAINE HCL (PF) 2 % IJ SOLN
INTRAMUSCULAR | Status: AC
  Filled 2022-04-22: qty 5

## 2022-04-22 MED ORDER — 0.9 % SODIUM CHLORIDE (POUR BTL) OPTIME
TOPICAL | Status: DC | PRN
  Administered 2022-04-22: 500 mL

## 2022-04-22 MED ORDER — PROPOFOL 10 MG/ML IV BOLUS
INTRAVENOUS | Status: DC | PRN
  Administered 2022-04-22: 50 mg via INTRAVENOUS
  Administered 2022-04-22: 140 mg via INTRAVENOUS

## 2022-04-22 MED ORDER — OXYCODONE HCL 5 MG/5ML PO SOLN: 5.0000 mg | Freq: Once | ORAL | Status: DC | PRN

## 2022-04-22 MED ORDER — HYDROMORPHONE HCL 1 MG/ML IJ SOLN
INTRAMUSCULAR | Status: AC
  Filled 2022-04-22: qty 1

## 2022-04-22 MED ORDER — LIDOCAINE HCL (CARDIAC) PF 100 MG/5ML IV SOSY
PREFILLED_SYRINGE | INTRAVENOUS | Status: DC | PRN
  Administered 2022-04-22: 100 mg via INTRAVENOUS

## 2022-04-22 MED ORDER — ROCURONIUM BROMIDE 100 MG/10ML IV SOLN
INTRAVENOUS | Status: DC | PRN
  Administered 2022-04-22: 10 mg via INTRAVENOUS
  Administered 2022-04-22: 60 mg via INTRAVENOUS

## 2022-04-22 SURGICAL SUPPLY — 67 items
ADH SKN CLS APL DERMABOND .7 (GAUZE/BANDAGES/DRESSINGS) ×1
APL SRG 38 LTWT LNG FL B (MISCELLANEOUS)
APPLICATOR ARISTA FLEXITIP XL (MISCELLANEOUS) IMPLANT
BARRIER ADHS 3X4 INTERCEED (GAUZE/BANDAGES/DRESSINGS) IMPLANT
BRR ADH 4X3 ABS CNTRL BYND (GAUZE/BANDAGES/DRESSINGS)
CATH FOLEY 3WAY  5CC 16FR (CATHETERS) ×1
CATH FOLEY 3WAY 5CC 16FR (CATHETERS) IMPLANT
COVER BACK TABLE 60X90IN (DRAPES) ×2 IMPLANT
COVER TIP SHEARS 8 DVNC (MISCELLANEOUS) ×1 IMPLANT
COVER TIP SHEARS 8MM DA VINCI (MISCELLANEOUS) ×1
DECANTER SPIKE VIAL GLASS SM (MISCELLANEOUS) ×6 IMPLANT
DEFOGGER SCOPE WARMER CLEARIFY (MISCELLANEOUS) ×2 IMPLANT
DERMABOND ADVANCED (GAUZE/BANDAGES/DRESSINGS) ×1
DERMABOND ADVANCED .7 DNX12 (GAUZE/BANDAGES/DRESSINGS) ×1 IMPLANT
DRAPE ARM DVNC X/XI (DISPOSABLE) ×4 IMPLANT
DRAPE COLUMN DVNC XI (DISPOSABLE) ×1 IMPLANT
DRAPE DA VINCI XI ARM (DISPOSABLE) ×4
DRAPE DA VINCI XI COLUMN (DISPOSABLE) ×1
DRAPE UTILITY XL STRL (DRAPES) ×2 IMPLANT
DURAPREP 26ML APPLICATOR (WOUND CARE) ×2 IMPLANT
ELECT REM PT RETURN 9FT ADLT (ELECTROSURGICAL) ×2
ELECTRODE REM PT RTRN 9FT ADLT (ELECTROSURGICAL) ×1 IMPLANT
GAUZE 4X4 16PLY ~~LOC~~+RFID DBL (SPONGE) ×6 IMPLANT
GAUZE PETROLATUM 1 X8 (GAUZE/BANDAGES/DRESSINGS) IMPLANT
GLOVE BIO SURGEON STRL SZ7 (GLOVE) ×6 IMPLANT
GLOVE BIOGEL PI IND STRL 7.0 (GLOVE) ×5 IMPLANT
GLOVE BIOGEL PI INDICATOR 7.0 (GLOVE) ×5
HEMOSTAT ARISTA ABSORB 3G PWDR (HEMOSTASIS) IMPLANT
HIBICLENS CHG 4% 4OZ (MISCELLANEOUS) ×2 IMPLANT
HOLDER FOLEY CATH W/STRAP (MISCELLANEOUS) IMPLANT
IRRIG SUCT STRYKERFLOW 2 WTIP (MISCELLANEOUS) ×2
IRRIGATION SUCT STRKRFLW 2 WTP (MISCELLANEOUS) ×1 IMPLANT
KIT TURNOVER CYSTO (KITS) ×2 IMPLANT
LEGGING LITHOTOMY PAIR STRL (DRAPES) ×2 IMPLANT
OBTURATOR OPTICAL STANDARD 8MM (TROCAR) ×1
OBTURATOR OPTICAL STND 8 DVNC (TROCAR) ×1
OBTURATOR OPTICALSTD 8 DVNC (TROCAR) ×1 IMPLANT
OCCLUDER COLPOPNEUMO (BALLOONS) ×2 IMPLANT
PACK ROBOT WH (CUSTOM PROCEDURE TRAY) ×2 IMPLANT
PACK ROBOTIC GOWN (GOWN DISPOSABLE) ×2 IMPLANT
PACK TRENDGUARD 450 HYBRID PRO (MISCELLANEOUS) ×1 IMPLANT
PAD OB MATERNITY 4.3X12.25 (PERSONAL CARE ITEMS) ×2 IMPLANT
PAD PREP 24X48 CUFFED NSTRL (MISCELLANEOUS) ×2 IMPLANT
PROTECTOR NERVE ULNAR (MISCELLANEOUS) ×2 IMPLANT
SEAL CANN UNIV 5-8 DVNC XI (MISCELLANEOUS) ×3 IMPLANT
SEAL XI 5MM-8MM UNIVERSAL (MISCELLANEOUS) ×3
SEALER VESSEL DA VINCI XI (MISCELLANEOUS) ×1
SEALER VESSEL EXT DVNC XI (MISCELLANEOUS) ×1 IMPLANT
SET IRRIG Y TYPE TUR BLADDER L (SET/KITS/TRAYS/PACK) IMPLANT
SET TRI-LUMEN FLTR TB AIRSEAL (TUBING) ×2 IMPLANT
SPONGE T-LAP 4X18 ~~LOC~~+RFID (SPONGE) ×2 IMPLANT
SUT VIC AB 0 CT1 36 (SUTURE) ×1 IMPLANT
SUT VIC AB 4-0 PS2 18 (SUTURE) ×4 IMPLANT
SUT VICRYL 0 UR6 27IN ABS (SUTURE) ×3 IMPLANT
SUT VLOC 180 0 9IN  GS21 (SUTURE) ×1
SUT VLOC 180 0 9IN GS21 (SUTURE) ×1 IMPLANT
TIP RUMI ORANGE 6.7MMX12CM (TIP) IMPLANT
TIP UTERINE 5.1X6CM LAV DISP (MISCELLANEOUS) IMPLANT
TIP UTERINE 6.7X10CM GRN DISP (MISCELLANEOUS) IMPLANT
TIP UTERINE 6.7X6CM WHT DISP (MISCELLANEOUS) IMPLANT
TIP UTERINE 6.7X8CM BLUE DISP (MISCELLANEOUS) ×1 IMPLANT
TOWEL OR 17X26 10 PK STRL BLUE (TOWEL DISPOSABLE) ×2 IMPLANT
TRAY FOL W/BAG SLVR 16FR STRL (SET/KITS/TRAYS/PACK) IMPLANT
TRAY FOLEY W/BAG SLVR 16FR LF (SET/KITS/TRAYS/PACK)
TRENDGUARD 450 HYBRID PRO PACK (MISCELLANEOUS) ×2
TROCAR PORT AIRSEAL 5X120 (TROCAR) ×2 IMPLANT
WATER STERILE IRR 500ML POUR (IV SOLUTION) ×1 IMPLANT

## 2022-04-22 NOTE — Anesthesia Postprocedure Evaluation (Signed)
Anesthesia Post Note ? ?Patient: Emily Morton ? ?Procedure(s) Performed: XI ROBOTIC ASSISTED LAPAROSCOPIC HYSTERECTOMY AND SALPINGECTOMY (Bilateral: Pelvis) ? ?  ? ?Patient location during evaluation: PACU ?Anesthesia Type: General ?Level of consciousness: awake and alert ?Pain management: pain level controlled ?Vital Signs Assessment: post-procedure vital signs reviewed and stable ?Respiratory status: spontaneous breathing, nonlabored ventilation and respiratory function stable ?Cardiovascular status: blood pressure returned to baseline ?Postop Assessment: no apparent nausea or vomiting ?Anesthetic complications: no ? ? ?No notable events documented. ? ?Last Vitals:  ?Vitals:  ? 04/22/22 1625 04/22/22 1700  ?BP: 116/81 113/75  ?Pulse:    ?Resp: 13 17  ?Temp: 36.7 ?C 36.4 ?C  ?SpO2:  97%  ?  ?Last Pain:  ?Vitals:  ? 04/22/22 1700  ?TempSrc:   ?PainSc: 7   ? ? ?  ?  ?  ?  ?  ?  ? ?Shanda Howells ? ? ? ? ?

## 2022-04-22 NOTE — Op Note (Signed)
04/22/2022 ?Emily Morton  ? ?Preoperative diagnosis:Recurrent cervical dysplasia. Status post LEEP x 2  ?Postop diagnosis: Same ?Procedure: da Vinci robot assisted total laparoscopic hysterectomy and bilateral salpingectomy ?Anesthesia Gen. Endotracheal ?Surgeon: Dr. Shea Evans ?Assistant: Dr Clance Boll ?IV fluids: 1000 cc LR ?EBL: 50 cc ?Urine output: 250 cc, clear in foley  ?Complications: none ?Pathology: Uterus with cervix and both fallopian tubes ?Disposition: PACU, stable ?Findings: Normal uterus and ovaries and fallopian tubes. Normal gross appearance of bowel and omentum and both ureters.  ? ?Procedure:  ?Indication:-Recurrent cervical dysplasia. H/o LEEP in 2012 and off and on abnormal Paps, recent persistent HPV x 2 yrs and Colposcopic cervical biopsies noted CIN -2. LEEP in Feb'23 noted CIN-1 but she declined conservative management and wanted hysterectomy.  ? ?Complications of surgery including infection, bleeding, damage to internal organs and other surgery related problems including pneumonia, VTE reviewed and informed written consent was obtained. She understood and gave informed written consent.  ? ?Patient was brought to the operating room with IV running. She received 2 gm Ancef.  She underwent general anesthesia without difficulty and was given dorsal lithotomy position, prepped and draped in sterile fashion. Foley catheter was placed. Cervix was exposed with a speculum and anterior lip of the cervix was grasped with tenaculum. Posterior lip of cervix was short, so a stay suture with 0-Vicryl placed on it for traction and to keep Koh ring flushed at cervico-vaginal junction. Uterus was sounded to 8 cm. A  # 8 Rumi tip and a medium Koh ring was assembled on the [employer] and entered in the uterine cavity and balloon was inflated to secure it in place. Koh ring was palpated again cervico-vaginal junction. Speculum was removed, tenaculum was left on the cervix.  ? ?I moved on to the  abdomen after gown and gloves were changes.  Supraumbilical 8 mm vertical incision made with scalpel after injecting Marcaine, fascia dissected, grasped with Kocher's and incised, posterior rectus sheath and peritoneum grasped, incised, intraabdominal entry confirmed. Purse string stay stitch on 0-Vicryl taken on fascia and central robot trocar with occluder seat introduced and Vicryl sutures secured. Pneumoperitoneum created. Laparoscope was introduced and the peritoneal cavity was evaluated. There was no evidence of adhesions. Trendelenburg position given. Port sited marked and injected with Marcaine. Two ports placed on the left, one was robotic port and one was Airseal port, and one robotic port placed on the right side, all inserted under vision. Robot was docked from the right. Vessel sealer inserted through arm 1, laparoscope in arm 2 and endo scissors in arm 3. 4th are was not used. Energy cables attached. Dr. Juliene Pina scrubbed out and went for surgical console. Dr Conni Elliot stayed at patient bedside.  ?Uterus, ovaries, tubes and ureters evaluated, appeared normal.  ?Uterus was deviated to the patient's right. The left salpingectomy performed by desiccating and cutting mesosalpinx. Incidentally partially salpingectomy was done so segment was placed in cul-de-sac and it was removed at the end per vaginal opening. Left utero-ovarian ligament was desiccated and cut.  Round ligament which was desiccated and cut. Anterior bladder broad ligament was opened and incised. Posterior broad ligament incised up to the uterosacral ligament and left uterine vessels skeletonized. Uterus was deviated to the left and right mesosalpinx desiccated and cut, entire tube was intact and was kept attached to the specimen. Right utero-ovarian ligament desiccated and cut and then right Round ligament was desiccated and cut.Marland Kitchen Anterior broad ligament was incised to create bladder flap, bladder was pushed away  by blunt and sharp dissection with  excellent hemostasis. Koh ring impression at cervicovaginal junction was seen well anteriorly. Right posterior broad ligament dissected, right Uterine vessels skeletonized. Right uterine vessels were desiccated and cut. Uterus was deviated to the right and the left uterine vessels were desiccated and cut. Vaginal occluder was inflated. Colpotomy was begun starting from midline anteriorly coming to the left and right and then circumferentially staying above the uterosacral ligaments posteriorly. Uterus, cervix, both tubes were pulled out of the vaginal opening and vaginal occluder was placed in the vagina to maintain pneumoperitoneum.  ?Vaginal cut edges were evaluated for hemostasis which was excellent. Irrigation was performed pedicles appeared dry. Robotic instrument switched for needle driver in arm 3. I used vessel sealer to assist with closure as tissue grasper. 0 V-Lock used for suturing vaginal cuff. Closure was excellent with hemostasis well controlled. Irrigation was performed, all pedicles and cuff closure appeared to be hemostatic.  ?Robotic instruments were removed. Robot was undocked. Patient was made supine. Lap'scope was reintroduced, hemostasis was excellent. All cannulas were removed under vision and pneumoperitoneum released. The stay sutures at the fascia tied together but pulled through. So fascia identified with retractors, grasped with Kochers and figure of 8 sticth with 0-Vicryl placed again for excellent closure. Skin approximated with subcuticular stitches on 4-0 Vicryl. Dermabond was applied. No vaginal bleeding noted on vaginal exam at the end. All instruments/lap/sponges counts were correct x2.  No complications. Patient tolerated procedure well and was reversed from anesthesia and brought to the PACU stable condition. Foley catheter was removed in OR before waking her up.  ? ?I performed this surgery ?--Shea Evans MD  ?[facility]  ?

## 2022-04-22 NOTE — Anesthesia Procedure Notes (Signed)
Procedure Name: Intubation ?Date/Time: 04/22/2022 2:13 PM ?Performed by: Justice Rocher, CRNA ?Pre-anesthesia Checklist: Patient identified, Emergency Drugs available, Suction available, Patient being monitored and Timeout performed ?Patient Re-evaluated:Patient Re-evaluated prior to induction ?Oxygen Delivery Method: Circle system utilized ?Preoxygenation: Pre-oxygenation with 100% oxygen ?Induction Type: IV induction ?Ventilation: Mask ventilation without difficulty ?Laryngoscope Size: Mac and 3 ?Grade View: Grade II ?Tube type: Oral ?Tube size: 7.0 mm ?Number of attempts: 1 ?Airway Equipment and Method: Stylet and Oral airway ?Placement Confirmation: ETT inserted through vocal cords under direct vision, positive ETCO2, breath sounds checked- equal and bilateral and CO2 detector ?Secured at: 22 cm ?Tube secured with: Tape ?Dental Injury: Teeth and Oropharynx as per pre-operative assessment  ? ? ? ? ?

## 2022-04-22 NOTE — Transfer of Care (Signed)
Immediate Anesthesia Transfer of Care Note ? ?Patient: Emily Morton ? ?Procedure(s) Performed: XI ROBOTIC ASSISTED LAPAROSCOPIC HYSTERECTOMY AND SALPINGECTOMY (Bilateral: Pelvis) ? ?Patient Location: PACU ? ?Anesthesia Type:General ? ?Level of Consciousness: awake, alert , oriented and patient cooperative ? ?Airway & Oxygen Therapy: Patient Spontanous Breathing ? ?Post-op Assessment: Report given to RN and Post -op Vital signs reviewed and stable ? ?Post vital signs: Reviewed and stable ? ?Last Vitals:  ?Vitals Value Taken Time  ?BP 116/81 04/22/22 1624  ?Temp    ?Pulse 84 04/22/22 1626  ?Resp 13 04/22/22 1626  ?SpO2 100 % 04/22/22 1626  ?Vitals shown include unvalidated device data. ? ?Last Pain:  ?Vitals:  ? 04/22/22 1119  ?TempSrc: Oral  ?PainSc: 0-No pain  ?   ? ?Patients Stated Pain Goal: 4 (04/22/22 1119) ? ?Complications: No notable events documented. ?

## 2022-04-22 NOTE — H&P (Signed)
Emily Morton is an 36 y.o. female. G0. Single at present, with recurrent cervical dysplasia.  ?H/o CIN 2 in 2012- s/p LEEP  off and on abnormal Paps since. Recent colposcopy for persistent HPV (16,18,45 negative) -Colpo Bx noted CIN-2, so she had LEEP (#2) - final path noted CIN 1 and negative margins. So she was advised close f/up. She is anxious about recurrent dysplasia and is requesting hysterectomy, not allowing progress to cancer. Reviewed typically progression to cancer from CIN I is rare and can have yearly Paps. She does not wants kids - never wanted kids and doesn't want to continue to have stress of risk of cervical cancer. She says she goes through a lot is stress with colposcopies and her sister accompanies her at those visits.  ? ?She has not taken Gardasil vaccines, can take if covered and call back ?She is using BC patch to prevent menses but reports very heavy painful period after LEEP  ? ?FamHx breast cancer- one maternal aunt and one of mom's cousin. Mother is fine  ?GHP nl at PCP -wnl.  ? ?Menstrual History: ?Patient's last menstrual period was 03/25/2022 (approximate). ?  ? ?Past Medical History:  ?Diagnosis Date  ? Altitude sickness 09/28/2020  ? After climbing to a high elevation, patient experienced SOB, HA, & N&V.  ? Anxiety   ? COVID-19 12/2020  ? flu-like symptoms, sore throat, headache, no treatment needed per pt  ? Depression   ? Eustachian tube dysfunction, bilateral 05/13/2017  ? IBS (irritable bowel syndrome)   ? PTSD (post-traumatic stress disorder)   ? Wrist fracture 10/10/2015  ? right  ? Wrist fracture 04/03/2021  ? right  ? ? ?Past Surgical History:  ?Procedure Laterality Date  ? COLONOSCOPY WITH ESOPHAGOGASTRODUODENOSCOPY (EGD)  11/19/2020  ? ELBOW FRACTURE SURGERY Left 1991  ? growth plate  ? LEEP  2012  ? LEEP  03/05/2022  ? ? ?Family History  ?Problem Relation Age of Onset  ? Fibromyalgia Sister   ? Breast cancer Cousin   ? Colon cancer Neg Hx   ? Esophageal cancer Neg Hx   ?  Rectal cancer Neg Hx   ? Stomach cancer Neg Hx   ? ? ?Social History:  reports that she quit smoking about 7 years ago. Her smoking use included cigarettes. She has never used smokeless tobacco. She reports current alcohol use. She reports that she does not use drugs. ? ?Allergies: No Known Allergies ? ?Medications Prior to Admission  ?Medication Sig Dispense Refill Last Dose  ? ALPRAZolam (XANAX) 0.5 MG tablet Take 0.5 mg by mouth at bedtime as needed for anxiety. Takes 1/2 tablet when needed   04/22/2022 at 0830  ? Cholecalciferol (VITAMIN D3) 25 MCG (1000 UT) CAPS Take by mouth.   Past Month  ? desvenlafaxine (PRISTIQ) 100 MG 24 hr tablet Take 100 mg by mouth daily.   04/22/2022 at 0830  ? Multiple Vitamin (MULTIVITAMIN WITH MINERALS) TABS tablet Take 1 tablet by mouth daily.   Past Month  ? Omega-3 Fatty Acids (FISH OIL PO) Take by mouth.   Past Month  ? Probiotic Product (PROBIOTIC-10 ULTIMATE) CAPS Take by mouth.   Past Month  ? dicyclomine (BENTYL) 10 MG capsule Take 1 capsule (10 mg total) by mouth every 8 (eight) hours as needed (abdominal cramps). 30 capsule 3 More than a month  ? ondansetron (ZOFRAN) 4 MG tablet Take 1 tablet (4 mg total) by mouth every 6 (six) hours as needed for nausea or vomiting. 30  tablet 3 More than a month  ? ? ?Review of Systems ? ?Blood pressure 116/78, pulse 66, temperature 97.9 ?F (36.6 ?C), temperature source Oral, resp. rate 16, height 5\' 7"  (1.702 m), weight 58.1 kg, last menstrual period 03/25/2022, SpO2 100 %. ?Physical Exam ?Physical exam:  ?A&O x 3, no acute distress. Pleasant ?HEENT neg, no thyromegaly ?Lungs CTA bilat ?CV RRR, S1S2 normal ?Abdo soft, non tender, non acute ?Extr no edema/ tenderness ?Pelvic Cx small, normal, healed from LEEP, uterus normal size, no adnexal masses ? ?Results for orders placed or performed during the hospital encounter of 04/22/22 (from the past 24 hour(s))  ?Pregnancy, urine POC     Status: None  ? Collection Time: 04/22/22 11:04 AM   ?Result Value Ref Range  ? Preg Test, Ur NEGATIVE NEGATIVE  ? ? ?  Latest Ref Rng & Units 04/20/2022  ?  9:34 AM    ?CBC  ?WBC 4.0 - 10.5 K/uL 7.1      ?Hemoglobin 12.0 - 15.0 g/dL 04/22/2022      ?Hematocrit 36.0 - 46.0 % 40.8      ?Platelets 150 - 400 K/uL 199      ? ? ?Assessment/Plan: ?Recurrent cervical dysplasia, hx of LEEP x 2 (recent 02/05/22) declines conservative management of CIN I and desires hysterectomy as definitive intervention for dysplasia to prevent cervical cancer ?Patient was counseled w/ her sister in room. Reviewed path and progression to cancer from CIN I not typical and continue Pap surveillance .She wants hysterectomy. She has expressed this in prior visit as well. She says she never wanted kids (sister confirms that) and does not want to worry about cervical cancer or needing another LEEP as she just had her 2nd LEEP. Reviewed risk of regret in future since still young and other risks incl SUI and vag prolapse following hysterectomy.  ?Risks/complications of surgery reviewed incl infection, bleeding, damage to internal organs including bladder, bowels, ureters, blood vessels, other risks from anesthesia, VTE and delayed complications of any surgery, complications in future surgery reviewed ? ?04/05/22 Karriem Muench ?04/22/2022, 12:21 PM ? ?

## 2022-04-22 NOTE — Anesthesia Preprocedure Evaluation (Addendum)
Anesthesia Evaluation  ?Patient identified by MRN, date of birth, ID band ?Patient awake ? ? ? ?Reviewed: ?Allergy & Precautions, NPO status , Patient's Chart, lab work & pertinent test results ? ?Airway ?Mallampati: II ? ?TM Distance: >3 FB ?Neck ROM: Full ? ?Mouth opening: Limited Mouth Opening ? Dental ?no notable dental hx. ?(+) Teeth Intact, Dental Advisory Given ?  ?Pulmonary ?former smoker,  ?  ?Pulmonary exam normal ?breath sounds clear to auscultation ? ? ? ? ? ? Cardiovascular ?negative cardio ROS ?Normal cardiovascular exam ?Rhythm:Regular Rate:Normal ? ? ?  ?Neuro/Psych ?PSYCHIATRIC DISORDERS Anxiety Depression negative neurological ROS ?   ? GI/Hepatic ?negative GI ROS, Neg liver ROS,   ?Endo/Other  ?negative endocrine ROS ? Renal/GU ?negative Renal ROS  ?negative genitourinary ?  ?Musculoskeletal ?negative musculoskeletal ROS ?(+)  ? Abdominal ?  ?Peds ? Hematology ?negative hematology ROS ?(+)   ?Anesthesia Other Findings ? ? Reproductive/Obstetrics ? ?  ? ? ? ? ? ? ? ? ? ? ? ? ? ?  ?  ? ? ? ? ? ? ?Anesthesia Physical ?Anesthesia Plan ? ?ASA: 2 ? ?Anesthesia Plan: General  ? ?Post-op Pain Management: Tylenol PO (pre-op)* and Ketamine IV*  ? ?Induction: Intravenous ? ?PONV Risk Score and Plan: 3 and Midazolam, Dexamethasone and Ondansetron ? ?Airway Management Planned: Oral ETT ? ?Additional Equipment:  ? ?Intra-op Plan:  ? ?Post-operative Plan: Extubation in OR ? ?Informed Consent: I have reviewed the patients History and Physical, chart, labs and discussed the procedure including the risks, benefits and alternatives for the proposed anesthesia with the patient or authorized representative who has indicated his/her understanding and acceptance.  ? ? ? ?Dental advisory given ? ?Plan Discussed with: CRNA ? ?Anesthesia Plan Comments: (2 IVs)  ? ? ? ? ? ? ?Anesthesia Quick Evaluation ? ?

## 2022-04-23 ENCOUNTER — Encounter (HOSPITAL_BASED_OUTPATIENT_CLINIC_OR_DEPARTMENT_OTHER): Payer: Self-pay | Admitting: Obstetrics & Gynecology

## 2022-04-23 DIAGNOSIS — Z803 Family history of malignant neoplasm of breast: Secondary | ICD-10-CM | POA: Diagnosis not present

## 2022-04-23 DIAGNOSIS — N870 Mild cervical dysplasia: Secondary | ICD-10-CM | POA: Diagnosis not present

## 2022-04-23 DIAGNOSIS — N871 Moderate cervical dysplasia: Secondary | ICD-10-CM | POA: Diagnosis not present

## 2022-04-23 DIAGNOSIS — F418 Other specified anxiety disorders: Secondary | ICD-10-CM | POA: Diagnosis not present

## 2022-04-23 DIAGNOSIS — Z87891 Personal history of nicotine dependence: Secondary | ICD-10-CM | POA: Diagnosis not present

## 2022-04-23 MED ORDER — OXYCODONE HCL 5 MG PO TABS
ORAL_TABLET | ORAL | Status: AC
  Filled 2022-04-23: qty 1

## 2022-04-23 MED ORDER — ACETAMINOPHEN 325 MG PO TABS
ORAL_TABLET | ORAL | Status: AC
Start: 2022-04-23 — End: ?
  Filled 2022-04-23: qty 2

## 2022-04-23 MED ORDER — KETOROLAC TROMETHAMINE 30 MG/ML IJ SOLN
INTRAMUSCULAR | Status: AC
  Filled 2022-04-23: qty 1

## 2022-04-23 MED ORDER — SIMETHICONE 80 MG PO CHEW
CHEWABLE_TABLET | ORAL | Status: AC
  Filled 2022-04-23: qty 1

## 2022-04-23 MED ORDER — OXYCODONE HCL 5 MG PO TABS: 5.0000 mg | ORAL_TABLET | Freq: Four times a day (QID) | ORAL | 0 refills | Status: AC | PRN

## 2022-04-23 MED ORDER — IBUPROFEN 600 MG PO TABS: 600.0000 mg | ORAL_TABLET | Freq: Four times a day (QID) | ORAL | 0 refills | Status: AC

## 2022-04-23 MED ORDER — ACETAMINOPHEN 325 MG PO TABS
650.0000 mg | ORAL_TABLET | Freq: Four times a day (QID) | ORAL | Status: AC | PRN
Start: 2022-04-23 — End: ?

## 2022-04-23 NOTE — Discharge Summary (Signed)
Physician Discharge Summary  ?Patient ID: ?Emily Morton ?MRN: [id number] ?DOB/AGE: 35-31-1987 35 y.o. ? ?Admit date: 04/22/2022 ?Discharge date: 04/23/2022 ? ?Admission Diagnoses: Persistent cervical dysplasia  ? ?Discharge Diagnoses:  ?Principal Problem: ?  Dysplasia of cervix, low grade (CIN 1) ?Active Problems: ?  Status post laparoscopic hysterectomy ? ? ?Discharged Condition: good ? ?Hospital Course: uncomplicated post-op stay ? ?Discharge Exam: ?Blood pressure 97/70, pulse 84, temperature 98.2 ?F (36.8 ?C), resp. rate 16, height 5\' 7"  (1.702 m), weight 58.1 kg, last menstrual period 03/25/2022, SpO2 97 %. ?  ?General: alert and cooperative ?Resp: clear to auscultation bilaterally ?Cardio: regular rate and rhythm, S1, S2 normal, no murmur, click, rub or gallop ?GI: soft, non-tender; bowel sounds normal; no masses,  no organomegaly and incision: clean and dry ?Extremities: extremities normal, atraumatic, no cyanosis or edema and Homans sign is negative, no sign of DVT ?Vaginal Bleeding: none ? ?Disposition: Home with sister  ? ?Discharge Instructions   ? ? Call MD for:   Complete by: As directed ?  ? Vaginal bleeding.  ? Call MD for:  difficulty breathing, headache or visual disturbances   Complete by: As directed ?  ? Call MD for:  hives   Complete by: As directed ?  ? Call MD for:  persistant dizziness or light-headedness   Complete by: As directed ?  ? Call MD for:  persistant nausea and vomiting   Complete by: As directed ?  ? Call MD for:  redness, tenderness, or signs of infection (pain, swelling, redness, odor or green/yellow discharge around incision site)   Complete by: As directed ?  ? Call MD for:  severe uncontrolled pain   Complete by: As directed ?  ? Call MD for:  temperature >100.4   Complete by: As directed ?  ? Diet - low sodium heart healthy   Complete by: As directed ?  ? Discharge wound care:   Complete by: As directed ?  ? Keep incisions clean and dry. Dab dry after showers  ? Driving  Restrictions   Complete by: As directed ?  ? 2 weeks  ? Increase activity slowly   Complete by: As directed ?  ? Lifting restrictions   Complete by: As directed ?  ? No more than 15 pounds for 6 weeks  ? Sexual Activity Restrictions   Complete by: As directed ?  ? No sex/ nothing in vagina, no swimming or sitting in bath tub until 6 weeks  ? ?  ? ?Allergies as of 04/23/2022   ?No Known Allergies ?  ? ?  ?Medication List  ?  ? ?TAKE these medications   ? ?acetaminophen 325 MG tablet ?Commonly known as: TYLENOL ?Take 2 tablets (650 mg total) by mouth every 6 (six) hours as needed for moderate pain or mild pain (temperature > 101.5.). ?  ?ALPRAZolam 0.5 MG tablet ?Commonly known as: 04/25/2022 ?Take 0.5 mg by mouth at bedtime as needed for anxiety. Takes 1/2 tablet when needed ?  ?desvenlafaxine 100 MG 24 hr tablet ?Commonly known as: PRISTIQ ?Take 100 mg by mouth daily. ?  ?dicyclomine 10 MG capsule ?Commonly known as: BENTYL ?Take 1 capsule (10 mg total) by mouth every 8 (eight) hours as needed (abdominal cramps). ?  ?FISH OIL PO ?Take by mouth. ?  ?ibuprofen 600 MG tablet ?Commonly known as: ADVIL ?Take 1 tablet (600 mg total) by mouth every 6 (six) hours. ?  ?multivitamin with minerals Tabs tablet ?Take 1 tablet by mouth daily. ?  ?  ondansetron 4 MG tablet ?Commonly known as: ZOFRAN ?Take 1 tablet (4 mg total) by mouth every 6 (six) hours as needed for nausea or vomiting. ?  ?oxyCODONE 5 MG immediate release tablet ?Commonly known as: Oxy IR/ROXICODONE ?Take 1 tablet (5 mg total) by mouth every 6 (six) hours as needed for up to 5 days for moderate pain or severe pain. ?  ?Probiotic-10 Ultimate Caps ?Take by mouth. ?  ?Vitamin D3 25 MCG (1000 UT) Caps ?Take by mouth. ?  ? ?  ? ?  ?  ? ? ?  ?Discharge Care Instructions  ?(From admission, onward)  ?  ? ? ?  ? ?  Start     Ordered  ? 04/23/22 0000  Discharge wound care:       ?Comments: Keep incisions clean and dry. Dab dry after showers  ? 04/23/22 0828  ? ?  ?  ? ?  ? ?  Follow-up Information   ? ? Shea Evans, MD Follow up in 2 week(s).   ?Specialty: Obstetrics and Gynecology ?Contact information: ?[address] ?[place] [address] ?[phone] ? ? ?  ?  ? ?  ?  ? ?  ? ? ?Signed: ?Robley Fries ?04/23/2022, 8:29 AM ? ? ?

## 2022-04-23 NOTE — Addendum Note (Signed)
Addendum  created 04/23/22 0824 by Bishop Limbo, CRNA  ? Charge Capture section accepted, Visit diagnoses modified  ?  ?

## 2022-04-23 NOTE — Progress Notes (Addendum)
1 Day Post-Op Procedure(s) (LRB): ?XI ROBOTIC ASSISTED LAPAROSCOPIC HYSTERECTOMY AND SALPINGECTOMY (Bilateral) ? ?Subjective: ?Patient reports some incision pain, meds helping. Some vaginal spotting earlier yesterday. Some urinary burning. Ate regular diet, ambulating, voiding. NO SOB/ CP  ? ?Objective: ?I have reviewed patient's vital signs, intake and output, and medications. ?BP 97/70 (BP Location: Right Arm)   Pulse 84   Temp 98.2 ?F (36.8 ?C)   Resp 16   Ht 5\' 7"  (1.702 m)   Wt 58.1 kg   LMP 03/25/2022 (Approximate)   SpO2 97%   BMI 20.05 kg/m?  ? ?General: alert and cooperative ?Resp: clear to auscultation bilaterally ?Cardio: regular rate and rhythm, S1, S2 normal, no murmur, click, rub or gallop ?GI: soft, non-tender; bowel sounds normal; no masses,  no organomegaly and incision: clean and dry ?Extremities: extremities normal, atraumatic, no cyanosis or edema and Homans sign is negative, no sign of DVT ?Vaginal Bleeding: none ? ?Assessment: ?s/p Procedure(s) with comments: ?XI ROBOTIC ASSISTED LAPAROSCOPIC HYSTERECTOMY AND SALPINGECTOMY (Bilateral) - Requests 3hrs.: stable ? ?Plan: ?Advance diet ?Advance to PO medication ?Discontinue IV fluids ?Discharge home ? LOS: 0 days  ?Post op care and warning ss d/w pt. F/up w/ me in office in 2 wks  ? ?[place] ?04/23/2022, 7:20 AM ? ? ? ? ?

## 2022-04-28 LAB — SURGICAL PATHOLOGY

## 2022-06-10 DIAGNOSIS — G5621 Lesion of ulnar nerve, right upper limb: Secondary | ICD-10-CM | POA: Diagnosis not present

## 2022-12-04 DIAGNOSIS — Z23 Encounter for immunization: Secondary | ICD-10-CM | POA: Diagnosis not present

## 2022-12-04 DIAGNOSIS — F418 Other specified anxiety disorders: Secondary | ICD-10-CM | POA: Diagnosis not present

## 2022-12-08 DIAGNOSIS — F418 Other specified anxiety disorders: Secondary | ICD-10-CM | POA: Diagnosis not present

## 2023-01-04 DIAGNOSIS — F419 Anxiety disorder, unspecified: Secondary | ICD-10-CM | POA: Diagnosis not present

## 2023-01-04 DIAGNOSIS — F902 Attention-deficit hyperactivity disorder, combined type: Secondary | ICD-10-CM | POA: Diagnosis not present

## 2023-01-04 DIAGNOSIS — Z79899 Other long term (current) drug therapy: Secondary | ICD-10-CM | POA: Diagnosis not present

## 2023-01-07 DIAGNOSIS — F902 Attention-deficit hyperactivity disorder, combined type: Secondary | ICD-10-CM | POA: Diagnosis not present

## 2023-02-12 DIAGNOSIS — S93402A Sprain of unspecified ligament of left ankle, initial encounter: Secondary | ICD-10-CM | POA: Diagnosis not present

## 2023-02-12 DIAGNOSIS — M7732 Calcaneal spur, left foot: Secondary | ICD-10-CM | POA: Diagnosis not present

## 2023-02-12 DIAGNOSIS — Y9301 Activity, walking, marching and hiking: Secondary | ICD-10-CM | POA: Diagnosis not present

## 2023-02-12 DIAGNOSIS — M25572 Pain in left ankle and joints of left foot: Secondary | ICD-10-CM | POA: Diagnosis not present

## 2023-02-12 DIAGNOSIS — M79672 Pain in left foot: Secondary | ICD-10-CM | POA: Diagnosis not present

## 2023-02-12 DIAGNOSIS — X501XXA Overexertion from prolonged static or awkward postures, initial encounter: Secondary | ICD-10-CM | POA: Diagnosis not present

## 2023-04-14 DIAGNOSIS — F902 Attention-deficit hyperactivity disorder, combined type: Secondary | ICD-10-CM | POA: Diagnosis not present

## 2023-04-14 DIAGNOSIS — Z79899 Other long term (current) drug therapy: Secondary | ICD-10-CM | POA: Diagnosis not present

## 2023-04-14 DIAGNOSIS — F419 Anxiety disorder, unspecified: Secondary | ICD-10-CM | POA: Diagnosis not present

## 2023-07-15 DIAGNOSIS — M461 Sacroiliitis, not elsewhere classified: Secondary | ICD-10-CM | POA: Diagnosis not present

## 2023-07-15 DIAGNOSIS — M5388 Other specified dorsopathies, sacral and sacrococcygeal region: Secondary | ICD-10-CM | POA: Diagnosis not present

## 2023-07-15 DIAGNOSIS — M5387 Other specified dorsopathies, lumbosacral region: Secondary | ICD-10-CM | POA: Diagnosis not present

## 2023-07-15 DIAGNOSIS — M5383 Other specified dorsopathies, cervicothoracic region: Secondary | ICD-10-CM | POA: Diagnosis not present

## 2023-07-19 DIAGNOSIS — M5388 Other specified dorsopathies, sacral and sacrococcygeal region: Secondary | ICD-10-CM | POA: Diagnosis not present

## 2023-07-19 DIAGNOSIS — M5383 Other specified dorsopathies, cervicothoracic region: Secondary | ICD-10-CM | POA: Diagnosis not present

## 2023-07-19 DIAGNOSIS — M461 Sacroiliitis, not elsewhere classified: Secondary | ICD-10-CM | POA: Diagnosis not present

## 2023-07-19 DIAGNOSIS — M5387 Other specified dorsopathies, lumbosacral region: Secondary | ICD-10-CM | POA: Diagnosis not present

## 2023-09-23 DIAGNOSIS — Z79899 Other long term (current) drug therapy: Secondary | ICD-10-CM | POA: Diagnosis not present

## 2023-09-23 DIAGNOSIS — F419 Anxiety disorder, unspecified: Secondary | ICD-10-CM | POA: Diagnosis not present

## 2023-09-23 DIAGNOSIS — F902 Attention-deficit hyperactivity disorder, combined type: Secondary | ICD-10-CM | POA: Diagnosis not present

## 2023-09-27 DIAGNOSIS — F902 Attention-deficit hyperactivity disorder, combined type: Secondary | ICD-10-CM | POA: Diagnosis not present

## 2023-12-09 DIAGNOSIS — Z23 Encounter for immunization: Secondary | ICD-10-CM | POA: Diagnosis not present

## 2023-12-09 DIAGNOSIS — F418 Other specified anxiety disorders: Secondary | ICD-10-CM | POA: Diagnosis not present

## 2023-12-20 DIAGNOSIS — J189 Pneumonia, unspecified organism: Secondary | ICD-10-CM | POA: Diagnosis not present

## 2023-12-27 DIAGNOSIS — J069 Acute upper respiratory infection, unspecified: Secondary | ICD-10-CM | POA: Diagnosis not present

## 2023-12-30 DIAGNOSIS — R0781 Pleurodynia: Secondary | ICD-10-CM | POA: Diagnosis not present

## 2023-12-30 DIAGNOSIS — R0789 Other chest pain: Secondary | ICD-10-CM | POA: Diagnosis not present

## 2023-12-30 DIAGNOSIS — Z79899 Other long term (current) drug therapy: Secondary | ICD-10-CM | POA: Diagnosis not present

## 2023-12-30 DIAGNOSIS — R079 Chest pain, unspecified: Secondary | ICD-10-CM | POA: Diagnosis not present

## 2024-01-27 DIAGNOSIS — M6281 Muscle weakness (generalized): Secondary | ICD-10-CM | POA: Diagnosis not present

## 2024-01-27 DIAGNOSIS — G90A Postural orthostatic tachycardia syndrome (POTS): Secondary | ICD-10-CM | POA: Diagnosis not present

## 2024-01-27 DIAGNOSIS — R0781 Pleurodynia: Secondary | ICD-10-CM | POA: Diagnosis not present

## 2024-01-27 DIAGNOSIS — M2569 Stiffness of other specified joint, not elsewhere classified: Secondary | ICD-10-CM | POA: Diagnosis not present
